# Patient Record
Sex: Male | Born: 1965
Health system: Southern US, Community
[De-identification: ages and names within clinical notes are randomized; demographics above are authoritative.]

## PROBLEM LIST (undated history)

## (undated) DIAGNOSIS — J302 Other seasonal allergic rhinitis: Secondary | ICD-10-CM

## (undated) DIAGNOSIS — C439 Malignant melanoma of skin, unspecified: Secondary | ICD-10-CM

## (undated) DIAGNOSIS — J189 Pneumonia, unspecified organism: Secondary | ICD-10-CM

## (undated) DIAGNOSIS — J90 Pleural effusion, not elsewhere classified: Secondary | ICD-10-CM

## (undated) DIAGNOSIS — K219 Gastro-esophageal reflux disease without esophagitis: Secondary | ICD-10-CM

## (undated) DIAGNOSIS — D1771 Benign lipomatous neoplasm of kidney: Secondary | ICD-10-CM

## (undated) HISTORY — PX: SPINAL FUSION: SHX223

## (undated) HISTORY — PX: CERVICAL DISCECTOMY: SHX98

---

## 2002-12-11 ENCOUNTER — Encounter: Payer: Self-pay | Admitting: Internal Medicine

## 2002-12-11 ENCOUNTER — Encounter: Admission: RE | Admit: 2002-12-11 | Discharge: 2002-12-11 | Payer: Self-pay | Admitting: Internal Medicine

## 2003-01-09 ENCOUNTER — Encounter: Admission: RE | Admit: 2003-01-09 | Discharge: 2003-01-09 | Payer: Self-pay | Admitting: Internal Medicine

## 2003-01-09 ENCOUNTER — Encounter: Payer: Self-pay | Admitting: Internal Medicine

## 2003-12-04 ENCOUNTER — Encounter (INDEPENDENT_AMBULATORY_CARE_PROVIDER_SITE_OTHER): Payer: Self-pay | Admitting: *Deleted

## 2003-12-04 ENCOUNTER — Ambulatory Visit (HOSPITAL_BASED_OUTPATIENT_CLINIC_OR_DEPARTMENT_OTHER): Admission: RE | Admit: 2003-12-04 | Discharge: 2003-12-04 | Payer: Self-pay | Admitting: General Surgery

## 2003-12-04 ENCOUNTER — Ambulatory Visit (HOSPITAL_COMMUNITY): Admission: RE | Admit: 2003-12-04 | Discharge: 2003-12-04 | Payer: Self-pay | Admitting: General Surgery

## 2003-12-09 ENCOUNTER — Emergency Department (HOSPITAL_COMMUNITY): Admission: EM | Admit: 2003-12-09 | Discharge: 2003-12-09 | Payer: Self-pay | Admitting: Emergency Medicine

## 2004-05-14 ENCOUNTER — Emergency Department (HOSPITAL_COMMUNITY): Admission: EM | Admit: 2004-05-14 | Discharge: 2004-05-14 | Payer: Self-pay | Admitting: Emergency Medicine

## 2004-05-15 ENCOUNTER — Inpatient Hospital Stay (HOSPITAL_COMMUNITY): Admission: EM | Admit: 2004-05-15 | Discharge: 2004-05-19 | Payer: Self-pay | Admitting: Emergency Medicine

## 2004-05-15 ENCOUNTER — Ambulatory Visit: Payer: Self-pay | Admitting: Infectious Diseases

## 2005-10-10 ENCOUNTER — Emergency Department (HOSPITAL_COMMUNITY): Admission: EM | Admit: 2005-10-10 | Discharge: 2005-10-10 | Payer: Self-pay | Admitting: Emergency Medicine

## 2007-04-07 ENCOUNTER — Emergency Department (HOSPITAL_COMMUNITY): Admission: EM | Admit: 2007-04-07 | Discharge: 2007-04-07 | Payer: Self-pay | Admitting: Family Medicine

## 2007-09-17 ENCOUNTER — Emergency Department (HOSPITAL_COMMUNITY): Admission: EM | Admit: 2007-09-17 | Discharge: 2007-09-17 | Payer: Self-pay | Admitting: Family Medicine

## 2007-10-16 ENCOUNTER — Emergency Department (HOSPITAL_COMMUNITY): Admission: EM | Admit: 2007-10-16 | Discharge: 2007-10-16 | Payer: Self-pay | Admitting: Family Medicine

## 2009-03-02 ENCOUNTER — Emergency Department (HOSPITAL_COMMUNITY): Admission: EM | Admit: 2009-03-02 | Discharge: 2009-03-02 | Payer: Self-pay | Admitting: Family Medicine

## 2009-09-07 ENCOUNTER — Emergency Department (HOSPITAL_COMMUNITY): Admission: EM | Admit: 2009-09-07 | Discharge: 2009-09-07 | Payer: Self-pay | Admitting: Emergency Medicine

## 2010-07-06 ENCOUNTER — Inpatient Hospital Stay (INDEPENDENT_AMBULATORY_CARE_PROVIDER_SITE_OTHER)
Admission: RE | Admit: 2010-07-06 | Discharge: 2010-07-06 | Disposition: A | Discharge status: Home / Self Care | Payer: BC Managed Care – PPO | Source: Ambulatory Visit | Attending: Family Medicine | Admitting: Family Medicine

## 2010-07-06 ENCOUNTER — Ambulatory Visit (INDEPENDENT_AMBULATORY_CARE_PROVIDER_SITE_OTHER): Payer: BC Managed Care – PPO

## 2010-07-06 DIAGNOSIS — J40 Bronchitis, not specified as acute or chronic: Secondary | ICD-10-CM

## 2010-08-21 NOTE — Op Note (Signed)
[name], [name]                            ACCOUNT NO.:  [id number]   MEDICAL RECORD NO.:  [id number]                   PATIENT TYPE:  AMB   LOCATION:  DSC                                  FACILITY:  [facility]   PHYSICIAN:  Leonie Man, M.D.                DATE OF BIRTH:  03-18-1966   DATE OF PROCEDURE:  12/04/2003  DATE OF DISCHARGE:                                 OPERATIVE REPORT   PREOPERATIVE DIAGNOSES:  1.  Multiple dysplastic compound nevi of the back.  2.  Sebaceous cysts of the back.   POSTOPERATIVE DIAGNOSES:  1.  Multiple dysplastic compound nevi of the back.  2.  Sebaceous cysts of the back.  3.  Pathology pending.   OPERATION/PROCEDURE:  Excision of multiple lesions on the back (five).   SURGEON:  Leonie Man, M.D.   ASSISTANT:  Nurse.   ANESTHESIA:  MAC using 0.5% Marcaine with epinephrine 1:200,000.   INDICATIONS:  This patient is a 45 year old man referred courtesy of Dr.  Doristine Section for the following excision of dysplastic compound nevi with  melanocytic atypia of his back.  The patient comes to the operating room now  for reexcision of these lesions.  He also has a small nevus overlying a  nodular mass of his back which I suspect to be a sebaceous cyst underlying a  nevus.  The risks and potential benefits of surgery have been discussed with  the patient and all questions answered and consent obtained.   DESCRIPTION OF PROCEDURE:  Following the induction of satisfactory sedation  with the patient positioned on the prone position, the upper back is prepped  and draped to be included in the sterile operative field.  Each of the  lesions is infiltrated with 0.5% Marcaine with epinephrine and excised as  follows:   Lesion #1 located in the upper mid central back was excised with an  elliptical incision.  Lesion #2, right lateral lower back was excised with an elliptical incision.  Lesion #3, right medial lower back was reexcised using an elliptical  incision.  Lesion #4, left lateral lower back was also excised in this manner.  Lower mid central back was really a sebaceous cyst with overlying nevus was  located and this was similarly excised.   All lesions were excised with full-thickness skin and some subcutaneous  tissues.  Hemostasis was obtained in all wounds.  The skin was undermined so  as to afford tension-free closure.  The skin incisions were closed with  subcuticular running sutures of 4-0 Monocryl and these were then reinforced  with Steri-Strips.  Sterile dressings applied.  The anesthetic was reversed  and the patient removed from the operating room to the recovery room in  stable condition.  He tolerated the procedure well.  Leonie Man, M.D.    PB/MEDQ  D:  12/04/2003  T:  12/04/2003  Job:  [id number]   cc:   Karie Soda. Joseph Art, M.D.  [address]  [place]  [place] [address]  Fax: [phone]

## 2010-08-21 NOTE — Op Note (Signed)
NAMERAGNAR, WAAS NO.:  [id number]   MEDICAL RECORD NO.:  [id number]                   PATIENT TYPE:  EMS   LOCATION:  ED                                   FACILITY:  [facility]   PHYSICIAN:  Sandria Bales. Ezzard Standing, M.D.               DATE OF BIRTH:  08-16-1965   DATE OF PROCEDURE:  12/09/2003  DATE OF DISCHARGE:                                 OPERATIVE REPORT   PREOPERATIVE DIAGNOSIS:  Burst wound, left back.   POSTOPERATIVE DIAGNOSIS:  Burst wound, left back approximately 2.5 cm in  length.   PROCEDURE:  Closure of wound with 3-0 nylon suture.   ANESTHESIA:  4 mL 1% Xylocaine with epinephrine.   COMPLICATIONS:  None.   SURGEON:  Dr. Ezzard Standing   INDICATIONS FOR PROCEDURE:  Mr. Elison is a 45 year old male, who had  multiple lesions excised from his back approximately 4 days ago.  One wound  on the left back burst open some time early this morning, and I spoke to his  wife, and he was brought to the emergency room for me to evaluate the wound.  I cleaned it with Betadine, irrigated it with saline, infiltrated the skin  with 1% Xylocaine, about 4 mL, and closed the wound with two interrupted 3-0  nylon sutures.   I explained to the patient and his wife, and his wife is a Engineer, civil (consulting), that there  may be an increased risk of infection.  But certainly most of the time,  closure this early after wound burst open, the wound will go ahead and heal  without problems.  They are to make an appointment already to see Dr. Lurene Shadow  back in approximately 2 weeks from the original excision of the wounds, and  that should be okay, and they will see him at that time.  Also, his other  Steri-Strips had come off, so actually I reinforced the other wounds with  Steri-Strips just so there would be no other problems.                                               Sandria Bales. Ezzard Standing, M.D.    DHN/MEDQ  D:  12/09/2003  T:  12/09/2003  Job:  [id number]   cc:   Leonie Man, M.D.  [address]  [address]  [place]  [place] [address]  Fax: [phone]

## 2010-08-21 NOTE — Discharge Summary (Signed)
[name], BODIE                ACCOUNT NO.:  [id number]   MEDICAL RECORD NO.:  [id number]          PATIENT TYPE:  INP   LOCATION:  5703                         FACILITY:  [facility]   PHYSICIAN:  Jonna L. Robb Matar, M.D.DATE OF BIRTH:  1965-05-26   DATE OF ADMISSION:  05/15/2004  DATE OF DISCHARGE:  05/19/2004                                 DISCHARGE SUMMARY   FINAL DIAGNOSES:  1.  Viral hepatitis, probably EBV or CMV.  2.  Hypokalemia.  3.  Chronic back pain secondary to multiple spinal surgeries.  4.  Abnormal ultrasound.   CONSULTATIONS:  Dr. Maurice March from Infectious Disease.   OPERATIONS:  None.   ALLERGIES:  None.   CODE STATUS:  Full.   HISTORY OF PRESENT ILLNESS:  This 45 year old man presented with fever,  headache that did not clear with 3 days of Zithromax. He had a sore throat  and swollen glands, which resolved and he has been left with persistent  headache, chills, and aching. In the emergency room, he was found to have a  temperature of 103. White count of 4.4 and elevated liver function tests. ET  was negative.   PHYSICAL EXAMINATION:  HEENT:  Besides the fever, did not show any icterus.  NECK:  There was an anterior scar on the neck.  LUNGS:  Clear.  ABDOMEN:  There was no hepatomegaly at that time.  BACK:  Multiple healed scars on the back and dysplastic nevi.   HOSPITAL COURSE:  The patient was put on Rocephin, which was discontinued in  48 hours. Evaluation of liver, in view of the fact that the patient took 5  or 6 grams of Tylenol for 2 day, but he does tend to drink 3 to 4 cans of  beer daily. Ultrasound showed fatty infiltration versus chronic hepatitis or  early cirrhosis. CT and MRI of the head was negative. Dr. Maurice March saw the  patient in consultation on May 17, 2004 and felt that there was no  bacterial process and he was more than likely to have a viral process.  Hepatitis A, B, and C titers were all negative. Malachi Carl was positive  for IGG  and negative for IGM and cytomegalic virus was still pending at the  time of discharge. The patient has some mild dehydration clinically,  although his BUN and creatinine remains normal. This improved with increased  fluids.   DISPOSITION:  The patient is discharged on some Roxicodone 5 mg every 4  hours p.r.n. headache. He is to get extra rest. Drink extra fluids. Avoid  Tylenol, aspirin or any other medications that would irritate his liver. He  should see Dr. Barbee Shropshire back in a week to compare his AST, which came in at  70 and was down to 56 and ALT, which was 71 and came down to 43. To followup  on the CMV and EBV. Blood cultures, stool cultures, urine cultures,  Giardia, Cryptosporidium were all negative. I also suggest getting a  followup ultrasound of the liver in about 6 to 8 weeks to make sure that  this is an acute  process that will clear, as opposed to some type of chronic  process that may require further evaluation.      JLB/MEDQ  D:  05/19/2004  T:  05/19/2004  Job:  [id number]   cc:   Olene Craven, M.D.  [address]  [address]  [place]  [place] [address]  Fax: [phone]

## 2010-08-21 NOTE — H&P (Signed)
NAMEBRESLIN, [name] NO.:  [id number]   MEDICAL RECORD NO.:  [id number]          PATIENT TYPE:  EMS   LOCATION:  MAJO                         FACILITY:  [facility]   PHYSICIAN:  Elliot Cousin, M.D.    DATE OF BIRTH:  09/03/1965   DATE OF ADMISSION:  05/15/2004  DATE OF DISCHARGE:                                HISTORY & PHYSICAL   </CHIEF COMPLAINT>/  :  Fever and headache.   HISTORY OF PRESENT ILLNESS:  The patient is a 45 year old man with a past  medical history significant for pneumonia, cervical diskectomy, and  hyperlipidemia, who presents to the emergency department with a history of  fever and headache.  The patient actually presented to the emergency  department yesterday with the same chief complaint.  During the evaluation  yesterday, a chest x-ray revealed a residual scar at the left lung base but  no acute abnormalities.  He was febrile with a temperature of 101.5 while in  the emergency department yesterday.  The patient was recently prescribed  Zithromax for three days prior to his presentation to the emergency  department yesterday.  He completed the three day course of Zithromax called  in by a physician on call for his primary care physician.  The patient was  sent home from the ED yesterday.  He got up this morning and, again, had  fever and chills and, therefore, returned to the emergency department.   The patient had a sore throat which started approximately six days ago.  The  sore throat was accompanied by a possible swollen lymph gland of his left  neck.  The sore throat and the swelling of his left neck completely  resolved.  A couple of days later, he developed a headache mostly in the  back of his eyes and in the temporal areas bilaterally.  The headache was  considered intense and throbbing.  The intensity was rated as an 8-9 over 10  in intensity.  He also had subjective fever, chills, and some achiness.  He  took Tylenol and Niquil as  well as Excedrin multiple times over the course  of the past 3-4 days.  These medications did help relieve the headache ,  fever,  and achiness to a small to moderate extent.  The patient no  complaints of a  stiff neck; he denies photophobia; no dizziness;  no nausea  and vomiting.  He has had some light headedness,  but no spinning dizziness.  He denies any visual changes.  He has had a dry cough on and off for the  past 3-4 days but no shortness of breath.  He has had loose stools x 2 but  no abdominal pain.  No pain with urination.  No bloody stools or bloody  urine.  No joint swelling.  No rash.   When the patient was seen in the emergency department this morning, his  temperature was 103.3, his white blood cell count was within normal limits  at 4.4, his urinalysis revealed 3-6 RBCs, and his SGOT was elevated at 70  and SGPT elevated  at 38.  The CT scan of the head revealed no acute disease.  The patient will be admitted for further observation and management.   PAST MEDICAL HISTORY:  1.  Pneumonia with effusion in June 2004.  2.  Gastroesophageal reflux disease.  3.  Hyperlipidemia.  4.  History of pulmonary nodule in the past followed by serial CT scans of      the chest.  5.  History of perforated septum/sinusitis in 2004.  6.  Cervical discectomy with hardware in 1998.  7.  L1 lumbar surgery with hardware in 1993.  8.  History of anal fissure.  9.  Multiple dysplastic compound nevi on the back status post excision in      August 2005 by Dr. Lurene Shadow.   MEDICATIONS:  1.  Prilosec OTC 1 tablet daily.  2.  Red rice yeast (supplement for hyperlipidemia).   ALLERGIES:  No known drug allergies.   SOCIAL HISTORY:  The patient is married and has one child.  He lives in  [place] with his wife.  His wife is currently a Engineer, civil (consulting) at [employer] [employer].  He is retired from [employer].  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  He denies tobacco use.  He drinks  3-4 beers  per night.  He denies any illicit drug use.  He can read and write.   FAMILY HISTORY:  The patient is adopted but recently found his biological  parents.  His medical history of his parents is limited, however, he states  that his biological father is 40 years of age and has osteoarthritis.  His  biological mother is 61 years of age and has peripheral vascular disease and  hypoglycemia.   REVIEW OF SYMPTOMS:  Positive for chronic low back pain and heartburn when  he does not take the Prilosec OTC.  Otherwise, the review of systems is  negative.   PHYSICAL EXAMINATION:  VITAL SIGNS:  Temperature 103.3, repeated at 98.7, blood pressure 131/68,  pulse 130, repeated at 93, respiratory rate 25, repeated at 20, oxygen  saturation 99 on room air.  GENERAL:  The patient is a pleasant, medium built 45 year old Caucasian man  who is currently lying in bed in no acute distress.  HEENT:  Head is normocephalic, atraumatic.  Pupils equal, round, reactive to  light.  Extraocular movements intact.  Conjunctivae clear.  Sclerae white.  Tympanic membranes are clear bilaterally.  No evidence of erythema or edema.  Nasal mucosa is moist.  No active drainage.  No tenderness over the  maxillary sinuses although there was some mild to moderate tenderness over  the ethmoid and frontal sinuses bilaterally.  NECK:  Supple.  There is a well healed anterior scar.  No nuchal rigidity.  No JVD, no adenopathy, no thyromegaly.  LUNGS:  Clear to auscultation bilaterally.  Breathing nonlabored.  HEART:  S1 and S2 with no murmurs, gallops, and rubs.  ABDOMEN:  Mildly obese, positive bowel sounds, soft, nontender,  nondistended, no hepatosplenomegaly.  RECTAL AND GU:  Deferred.  EXTREMITIES:  Pedal pulses are 2+ bilaterally.  No pretibial edema, no pedal  edema.  No acute joint changes.  The patient has a good range of motion of  all his joints. NEUROLOGICAL:  The patient is alert and oriented x 3.   Cranial nerves 2-12  are intact.  Strength 5/5 throughout, sensation is intact, gait is within  normal limits.  No asterixis.  Cerebellar is within normal limits.  Kernig's  sign negative.  SKIN:  The patient has multiple well healed scars on his back status post  excision of nevi.  He has a well healed lumbar scar.   ADMISSION LABORATORY DATA:  CT scan of the head is completely negative.  Chest x-ray reveals abnormal density in the right cardiophrenic angle.  Question new since September 2004.  CT scan of the chest for follow up of  the chest x-ray findings reveal normal epicardial fat pad, no mass  (preliminary reading).  WBC 4.4, hemoglobin 16.3, hematocrit 45.8, MCV 86.1,  platelets 185, absolute neutrophil count 3.  Urinalysis color yellow,  glucose negative, hemoglobin large, urine spec. grav. 1.022, ketones  negative, protein 100, urobilinogen 0.2, nitrite negative, leukocyte  esterase negative, WBC 0-2, RBC 3-6.  Sodium 133, potassium 3.4, chloride  102, CO2 21, glucose 107, BUN 8, creatinine 1.3, calcium 8.8, total protein  7.1, albumin 3.7, AST 70, ALT 71, alkaline phos 89, total bilirubin 0.9.   ASSESSMENT:  1.  Fever, chills, headache.  The differential diagnoses include viral      syndrome/upper respiratory infection, bacterial versus viral sinusitis,      viral meningitis.  Bacterial meningitis is less likely.  Currently, the      patient does not have any signs of meningismus.  He is febrile and does      have mild to moderate ethmoid sinus tenderness.  There are no focal      neurological deficits.  2.  Possible ethmoid/frontal sinusitis.  The results of the CT scan of the      head were completely normal, although specifically there were no      specific assessments or specific results regarding the patient's      sinuses.  3.  Elevated liver transaminases.  The patient admits to drinking  beer 3-4      cans nightly.  However, his wife who is a nurse points out that  the      patient has taken at least 5-6 grams of Tylenol over the past two days.      The amount of Tylenol could actually be more.  4.  Microhematuria/hemoglobinuria.  The patient has no complaints of      dysuria.  There has been no evidence of gross hematuria.   PLAN:  1.  The patient will be admitted for further evaluation.  2.  Blood cultures x 2 have been ordered.  3.  The patient was given Tylenol in the emergency department for fever.  4.  Will start Rocephin 1 gram IV empirically.  5.  Will hold Tylenol until acetaminophen level comes back and treat fever      as needed with Motrin.  6.  Nasonex b.i.d. and as needed saline nasal spray.  7.  Will check a creatinine kinase level, urine for myoglobin, and a STAT      acetaminophen level.  8.  Will repeat urinalysis in the morning.  9.  Will also check a viral hepatitis panel.  10. Will assess the patient's sinuses with a dedicated CT scan of the     sinuses.  Will also assess the patient's hepatobiliary tree with an      ultrasound of the abdomen.  11. IV fluids at 100 mL an hour (the patient has already received 1.5 liters      of normal saline).  12. Will hold on further assessment with LP and MRI of the brain because of      probable low  yield at this point.  13. Prn Ativan.      DF/MEDQ  D:  05/15/2004  T:  05/15/2004  Job:  [id number]   cc:   Olene Craven, M.D.  [address]  [address]  [place]  [place] [address]  Fax: [phone]

## 2010-11-03 ENCOUNTER — Other Ambulatory Visit: Payer: Self-pay | Admitting: Dermatology

## 2010-11-19 ENCOUNTER — Other Ambulatory Visit: Payer: Self-pay | Admitting: Dermatology

## 2010-12-08 ENCOUNTER — Other Ambulatory Visit: Payer: Self-pay | Admitting: Dermatology

## 2011-06-06 ENCOUNTER — Encounter (HOSPITAL_COMMUNITY): Payer: Self-pay | Admitting: *Deleted

## 2011-06-06 ENCOUNTER — Emergency Department (INDEPENDENT_AMBULATORY_CARE_PROVIDER_SITE_OTHER)
Admission: EM | Admit: 2011-06-06 | Discharge: 2011-06-06 | Disposition: A | Discharge status: Home / Self Care | Payer: Managed Care, Other (non HMO) | Source: Home / Self Care | Attending: Emergency Medicine | Admitting: Emergency Medicine

## 2011-06-06 DIAGNOSIS — R05 Cough: Secondary | ICD-10-CM

## 2011-06-06 DIAGNOSIS — R059 Cough, unspecified: Secondary | ICD-10-CM

## 2011-06-06 HISTORY — DX: Pneumonia, unspecified organism: J18.9

## 2011-06-06 HISTORY — DX: Other seasonal allergic rhinitis: J30.2

## 2011-06-06 HISTORY — DX: Pleural effusion, not elsewhere classified: J90

## 2011-06-06 HISTORY — DX: Gastro-esophageal reflux disease without esophagitis: K21.9

## 2011-06-06 MED ORDER — PSEUDOEPHEDRINE-GUAIFENESIN ER 120-1200 MG PO TB12: 1.0000 | ORAL_TABLET | Freq: Two times a day (BID) | ORAL | Status: DC | PRN

## 2011-06-06 MED ORDER — FLUTICASONE PROPIONATE 50 MCG/ACT NA SUSP: 2.0000 | Freq: Every day | NASAL | Status: DC

## 2011-06-06 MED ORDER — HYDROCODONE-ACETAMINOPHEN 7.5-500 MG/15ML PO SOLN: 5.0000 mL | Freq: Four times a day (QID) | ORAL | Status: AC | PRN

## 2011-06-06 MED ORDER — ALBUTEROL SULFATE HFA 108 (90 BASE) MCG/ACT IN AERS: 1.0000 | INHALATION_SPRAY | Freq: Four times a day (QID) | RESPIRATORY_TRACT | Status: DC | PRN

## 2011-06-06 NOTE — ED Provider Notes (Signed)
History     CSN: [id number]  Arrival date & time 06/06/11  4098   First MD Initiated Contact with Patient 06/06/11 [phone]      Chief Complaint  Patient presents with  . Cough  . Diarrhea    (Consider location/radiation/quality/duration/timing/severity/associated sxs/prior treatment) HPI Comments: Patient with dry, nonproductive cough primarily at night for 2 weeks. Reports postnasal drip, nasal congestion. States he has a "tickle in the back of his throat", which is causing the coughing. Starting yesterday, reports bodyaches, malaise, occasional abdominal cramping and several episodes of watery, nonbloody diarrhea. He feels feverish, but no documented fevers at home. Reports coughing spell with posttussive emesis today. Is otherwise tolerating by mouth. No chest pain, wheezing, shortness of breath, abdominal pain. Has been taking Delsym without relief. Is taking Protonix for reflux, denies any reflux type symptoms. No other medications. No acute, watery eyes, allergy-type symptoms. Has a history of pneumonia, remote.   ROS as noted in HPI. All other ROS negative.   Patient is a 46 y.o. male presenting with cough and diarrhea. The history is provided by the patient. No language interpreter was used.  Cough This is a new problem. The current episode started more than 1 week ago. The cough is non-productive. There has been no fever. Associated symptoms include rhinorrhea, sore throat and myalgias. Pertinent negatives include no chest pain, no chills, no sweats, no weight loss, no ear pain, no headaches, no shortness of breath and no wheezing. He has tried cough syrup for the symptoms. The treatment provided mild relief. Smoker: Former smoker. His past medical history is significant for pneumonia. His past medical history does not include bronchitis, COPD, emphysema or asthma.  Diarrhea The primary symptoms include diarrhea and myalgias. Primary symptoms do not include weight loss.  The illness  does not include chills.    Past Medical History  Diagnosis Date  . GERD (gastroesophageal reflux disease)   . Seasonal allergies   . Pneumonia   . Pleural effusion, left     Past Surgical History  Procedure Date  . Spinal fusion   . Cervical discectomy     History reviewed. No pertinent family history.  History  Substance Use Topics  . Smoking status: Former Games developer  . Smokeless tobacco: Not on file  . Alcohol Use: Yes     Occasional      Review of Systems  Constitutional: Negative for chills and weight loss.  HENT: Positive for sore throat and rhinorrhea. Negative for ear pain.   Respiratory: Positive for cough. Negative for shortness of breath and wheezing.   Cardiovascular: Negative for chest pain.  Gastrointestinal: Positive for diarrhea.  Musculoskeletal: Positive for myalgias.  Neurological: Negative for headaches.    Allergies  Review of patient's allergies indicates no known allergies.  Home Medications   Current Outpatient Rx  Name Route Sig Dispense Refill  . OMEPRAZOLE MAGNESIUM 20 MG PO TBEC Oral Take 20 mg by mouth daily.    . ALBUTEROL SULFATE HFA 108 (90 BASE) MCG/ACT IN AERS Inhalation Inhale 1-2 puffs into the lungs every 6 (six) hours as needed for wheezing. 1 Inhaler 0  . FLUTICASONE PROPIONATE 50 MCG/ACT NA SUSP Nasal Place 2 sprays into the nose daily. 16 g 0  . HYDROCODONE-ACETAMINOPHEN 7.5-500 MG/15ML PO SOLN Oral Take 5 mLs by mouth every 6 (six) hours as needed for pain. 120 mL 0  . PSEUDOEPHEDRINE-GUAIFENESIN ER [phone] MG PO TB12 Oral Take 1 tablet by mouth 2 (two) times daily as needed (  congestion). 20 each 0    BP 139/93  Pulse 102  Temp(Src) 98.1 F (36.7 C) (Oral)  Resp 19  SpO2 96%  Physical Exam  Nursing note and vitals reviewed. Constitutional: He is oriented to person, place, and time. He appears well-developed and well-nourished. No distress.  HENT:  Head: Normocephalic and atraumatic.  Left Ear: Tympanic membrane  normal.  Mouth/Throat: Oropharynx is clear and moist.       Irritated nasal mucosa, crusted blood. Oropharynx cobblestone, irritated. Tonsils within normal limits  Eyes: Conjunctivae and EOM are normal.  Neck: Normal range of motion.  Cardiovascular: Normal rate, regular rhythm, normal heart sounds and intact distal pulses.   Pulmonary/Chest: Effort normal and breath sounds normal. He exhibits no tenderness.  Abdominal: He exhibits no distension.  Musculoskeletal: Normal range of motion.  Neurological: He is alert and oriented to person, place, and time.  Skin: Skin is warm and dry.  Psychiatric: He has a normal mood and affect. His behavior is normal.    ED Course  Procedures (including critical care time)   Labs Reviewed  POCT RAPID STREP A ([facility] ONLY)   No results found.   1. Cough     Results for orders placed during the hospital encounter of 06/06/11  POCT RAPID STREP A ([facility] ONLY)      Component Value Range   Streptococcus, Group A Screen (Direct) NEGATIVE  NEGATIVE      MDM  No fevers, lungs clear. Has a history of reflux, but also has apparent URI-like symptoms. Doubt pneumonia at this time.  Deferring chest x-ray. Will try supportive treatment, and have him followup with his PMD if needed.  Luiz Blare, MD 06/06/11 1121

## 2011-06-06 NOTE — ED Notes (Signed)
Started w/ productive cough "just in mornings" over past 2 weeks.  Starting 2-3 days ago cough started progressing throughout the day.  Yesterday "started not feeling well", started with diarrhea and abd cramping.  Last night had severe coughing fit which caused dry heaves.  Unsure if any fevers at home.  Has been taking Delsym and OTC cold med.

## 2011-07-29 ENCOUNTER — Other Ambulatory Visit: Payer: Self-pay | Admitting: Dermatology

## 2014-03-17 ENCOUNTER — Emergency Department (HOSPITAL_COMMUNITY): Payer: 59

## 2014-03-17 ENCOUNTER — Inpatient Hospital Stay (HOSPITAL_COMMUNITY)
Admission: EM | Admit: 2014-03-17 | Discharge: 2014-03-22 | DRG: 981 | Disposition: A | Discharge status: Home / Self Care | Payer: 59 | Attending: Urology | Admitting: Urology

## 2014-03-17 ENCOUNTER — Encounter (HOSPITAL_COMMUNITY): Payer: Self-pay | Admitting: *Deleted

## 2014-03-17 DIAGNOSIS — D1771 Benign lipomatous neoplasm of kidney: Principal | ICD-10-CM | POA: Diagnosis present

## 2014-03-17 DIAGNOSIS — R58 Hemorrhage, not elsewhere classified: Secondary | ICD-10-CM | POA: Insufficient documentation

## 2014-03-17 DIAGNOSIS — D62 Acute posthemorrhagic anemia: Secondary | ICD-10-CM | POA: Diagnosis present

## 2014-03-17 DIAGNOSIS — I722 Aneurysm of renal artery: Secondary | ICD-10-CM | POA: Diagnosis present

## 2014-03-17 DIAGNOSIS — Z87891 Personal history of nicotine dependence: Secondary | ICD-10-CM

## 2014-03-17 DIAGNOSIS — K219 Gastro-esophageal reflux disease without esophagitis: Secondary | ICD-10-CM | POA: Diagnosis present

## 2014-03-17 DIAGNOSIS — N179 Acute kidney failure, unspecified: Secondary | ICD-10-CM | POA: Diagnosis present

## 2014-03-17 DIAGNOSIS — D3001 Benign neoplasm of right kidney: Secondary | ICD-10-CM

## 2014-03-17 DIAGNOSIS — K661 Hemoperitoneum: Secondary | ICD-10-CM | POA: Diagnosis present

## 2014-03-17 DIAGNOSIS — Z85820 Personal history of malignant melanoma of skin: Secondary | ICD-10-CM

## 2014-03-17 DIAGNOSIS — D72829 Elevated white blood cell count, unspecified: Secondary | ICD-10-CM | POA: Diagnosis present

## 2014-03-17 DIAGNOSIS — N2889 Other specified disorders of kidney and ureter: Secondary | ICD-10-CM | POA: Diagnosis present

## 2014-03-17 DIAGNOSIS — D179 Benign lipomatous neoplasm, unspecified: Secondary | ICD-10-CM

## 2014-03-17 HISTORY — DX: Malignant melanoma of skin, unspecified: C43.9

## 2014-03-17 LAB — CBC WITH DIFFERENTIAL/PLATELET
BASOS PCT: 0 % (ref 0–1)
Basophils Absolute: 0.1 10*3/uL (ref 0.0–0.1)
EOS ABS: 0 10*3/uL (ref 0.0–0.7)
EOS PCT: 0 % (ref 0–5)
HCT: 41.1 % (ref 39.0–52.0)
Hemoglobin: 14.7 g/dL (ref 13.0–17.0)
LYMPHS ABS: 1.1 10*3/uL (ref 0.7–4.0)
LYMPHS PCT: 6 % — AB (ref 12–46)
MCH: 32.2 pg (ref 26.0–34.0)
MCHC: 35.8 g/dL (ref 30.0–36.0)
MCV: 89.9 fL (ref 78.0–100.0)
MONO ABS: 1.6 10*3/uL — AB (ref 0.1–1.0)
MONOS PCT: 8 % (ref 3–12)
NEUTROS PCT: 86 % — AB (ref 43–77)
Neutro Abs: 16.7 10*3/uL — ABNORMAL HIGH (ref 1.7–7.7)
Platelets: 222 10*3/uL (ref 150–400)
RBC: 4.57 MIL/uL (ref 4.22–5.81)
RDW: 12 % (ref 11.5–15.5)
WBC: 19.5 10*3/uL — AB (ref 4.0–10.5)

## 2014-03-17 LAB — COMPREHENSIVE METABOLIC PANEL
ALK PHOS: 56 U/L (ref 39–117)
ALT: 22 U/L (ref 0–53)
ANION GAP: 25 — AB (ref 5–15)
AST: 31 U/L (ref 0–37)
Albumin: 4.3 g/dL (ref 3.5–5.2)
BILIRUBIN TOTAL: 0.9 mg/dL (ref 0.3–1.2)
BUN: 23 mg/dL (ref 6–23)
CALCIUM: 9.6 mg/dL (ref 8.4–10.5)
CO2: 18 mEq/L — ABNORMAL LOW (ref 19–32)
CREATININE: 1.36 mg/dL — AB (ref 0.50–1.35)
Chloride: 95 mEq/L — ABNORMAL LOW (ref 96–112)
GFR calc Af Amer: 69 mL/min — ABNORMAL LOW (ref >90)
GFR calc non Af Amer: 60 mL/min — ABNORMAL LOW (ref >90)
Glucose, Bld: 166 mg/dL — ABNORMAL HIGH (ref 70–99)
POTASSIUM: 4 meq/L (ref 3.7–5.3)
Sodium: 138 mEq/L (ref 137–147)
TOTAL PROTEIN: 7.9 g/dL (ref 6.0–8.3)

## 2014-03-17 LAB — LIPASE, BLOOD: LIPASE: 44 U/L (ref 11–59)

## 2014-03-17 LAB — URINALYSIS, ROUTINE W REFLEX MICROSCOPIC
BILIRUBIN URINE: NEGATIVE
GLUCOSE, UA: NEGATIVE mg/dL
Hgb urine dipstick: NEGATIVE
KETONES UR: 15 mg/dL — AB
Leukocytes, UA: NEGATIVE
NITRITE: NEGATIVE
PH: 6 (ref 5.0–8.0)
PROTEIN: NEGATIVE mg/dL
SPECIFIC GRAVITY, URINE: 1.014 (ref 1.005–1.030)
UROBILINOGEN UA: 0.2 mg/dL (ref 0.0–1.0)

## 2014-03-17 LAB — I-STAT CG4 LACTIC ACID, ED: LACTIC ACID, VENOUS: 2.79 mmol/L — AB (ref 0.5–2.2)

## 2014-03-17 MED ORDER — HYDROMORPHONE HCL 1 MG/ML IJ SOLN
0.5000 mg | INTRAMUSCULAR | Status: DC | PRN
  Administered 2014-03-17 – 2014-03-22 (×16): 1 mg via INTRAVENOUS
  Filled 2014-03-17 (×16): qty 1

## 2014-03-17 MED ORDER — HYDROMORPHONE HCL 1 MG/ML IJ SOLN
1.0000 mg | Freq: Once | INTRAMUSCULAR | Status: AC
  Administered 2014-03-17: 1 mg via INTRAVENOUS
  Filled 2014-03-17: qty 1

## 2014-03-17 MED ORDER — SODIUM CHLORIDE 0.9 % IV BOLUS (SEPSIS)
1000.0000 mL | Freq: Once | INTRAVENOUS | Status: AC
  Administered 2014-03-17: 1000 mL via INTRAVENOUS

## 2014-03-17 MED ORDER — KCL IN DEXTROSE-NACL 20-5-0.9 MEQ/L-%-% IV SOLN
INTRAVENOUS | Status: DC
  Administered 2014-03-18: 01:00:00 via INTRAVENOUS
  Filled 2014-03-17 (×3): qty 1000

## 2014-03-17 MED ORDER — ONDANSETRON HCL 4 MG/2ML IJ SOLN
4.0000 mg | INTRAMUSCULAR | Status: DC | PRN
  Administered 2014-03-17: 4 mg via INTRAVENOUS
  Filled 2014-03-17: qty 2

## 2014-03-17 MED ORDER — DIPHENHYDRAMINE HCL 50 MG/ML IJ SOLN: 12.5000 mg | Freq: Four times a day (QID) | INTRAMUSCULAR | Status: DC | PRN

## 2014-03-17 MED ORDER — ALBUTEROL SULFATE HFA 108 (90 BASE) MCG/ACT IN AERS: 1.0000 | INHALATION_SPRAY | Freq: Four times a day (QID) | RESPIRATORY_TRACT | Status: DC | PRN

## 2014-03-17 MED ORDER — DIPHENHYDRAMINE HCL 12.5 MG/5ML PO ELIX: 12.5000 mg | ORAL_SOLUTION | Freq: Four times a day (QID) | ORAL | Status: DC | PRN

## 2014-03-17 MED ORDER — PROCHLORPERAZINE EDISYLATE 5 MG/ML IJ SOLN
10.0000 mg | Freq: Once | INTRAMUSCULAR | Status: AC
  Administered 2014-03-17: 10 mg via INTRAVENOUS
  Filled 2014-03-17: qty 2

## 2014-03-17 MED ORDER — FLUTICASONE PROPIONATE 50 MCG/ACT NA SUSP
2.0000 | Freq: Every day | NASAL | Status: DC
  Administered 2014-03-19 – 2014-03-22 (×3): 2 via NASAL
  Filled 2014-03-17: qty 16

## 2014-03-17 MED ORDER — DIAZEPAM 5 MG/ML IJ SOLN
5.0000 mg | Freq: Once | INTRAMUSCULAR | Status: AC
  Administered 2014-03-17: 5 mg via INTRAVENOUS
  Filled 2014-03-17: qty 2

## 2014-03-17 MED ORDER — FENTANYL CITRATE 0.05 MG/ML IJ SOLN
50.0000 ug | Freq: Once | INTRAMUSCULAR | Status: AC
  Administered 2014-03-17: 50 ug via INTRAVENOUS
  Filled 2014-03-17: qty 2

## 2014-03-17 MED ORDER — DOCUSATE SODIUM 100 MG PO CAPS
100.0000 mg | ORAL_CAPSULE | Freq: Two times a day (BID) | ORAL | Status: DC
  Administered 2014-03-18 – 2014-03-22 (×8): 100 mg via ORAL
  Filled 2014-03-17 (×10): qty 1

## 2014-03-17 MED ORDER — ONDANSETRON HCL 4 MG/2ML IJ SOLN
4.0000 mg | Freq: Once | INTRAMUSCULAR | Status: AC
  Administered 2014-03-17: 4 mg via INTRAVENOUS
  Filled 2014-03-17: qty 2

## 2014-03-17 NOTE — ED Notes (Addendum)
Pt was raking leaves when he had a sudden onset of R lower back pain that radiates to R upper back.  Pain is accompanied by nausea.  Pt states he can tolerate sitting if he is hanging R leg over bed.  Pain increases with movement and inspiration.  Given 4 mg zofran by [facility] for nausea.  EKG unremarkable per EMS.  Hx of injury to same area.

## 2014-03-17 NOTE — ED Notes (Signed)
MD at bedside. 

## 2014-03-17 NOTE — ED Notes (Signed)
[facility] contacted for transport to [facility]

## 2014-03-17 NOTE — ED Notes (Signed)
Pt went to X-Ray.  

## 2014-03-17 NOTE — H&P (Signed)
H&P  Chief Complaint: Right renal/retroperitoneal mass  PCP: Dr. Domenick Gong  History of Present Illness: [name] is a 48 y.o. year old previously seen by Dr. Louis Meckel in our office last year.  He developed the acute onset of right flank pain today while working outside at his house which progressively became severe and was associated with shortness of breath and nausea.  He also felt dizzy and light headed.  He called 911 and was transported to the [facility] ED for evaluation.  He has been tachycardic here and his pain has been difficult to control still requiring frequent IV narcotic pain medication.  His Hgb is 14.6 and his WBC is over 19,000.  He had a CT stone study performed which revealed a large hemorrhagic right retroperitoneal mass with extensive hemorrhage and what appeared to be fat within the mass.  He retrospectively gives a history of being told he had a mass on the right kidney over 10 years ago and remembers it being called an angiomyolipoma.  He has not had follow up for that in over a decade.  An abdominal ultrasound was performed in February 2006 and at that time his right kidney appeared unremarkable.  It is unclear exactly when his diagnosis of an AML was made or what imaging he had at that time.  Past Medical History  Diagnosis Date  . GERD (gastroesophageal reflux disease)   . Seasonal allergies   . Pneumonia   . Pleural effusion, left   . Melanoma    Melanoma was superficial and has not required ongoing treatment.  Past Surgical History  Procedure Laterality Date  . Spinal fusion    . Cervical discectomy      Home Medications:    Medication List    ASK your doctor about these medications        albuterol 108 (90 BASE) MCG/ACT inhaler  Commonly known as:  PROVENTIL HFA;VENTOLIN HFA  Inhale 1-2 puffs into the lungs every 6 (six) hours as needed for wheezing.     fluticasone 50 MCG/ACT nasal spray  Commonly known as:  FLONASE  Place 2 sprays into the  nose daily.     omeprazole 20 MG tablet  Commonly known as:  PRILOSEC OTC  Take 20 mg by mouth daily.     Pseudoephedrine-Guaifenesin [phone] MG Tb12  Commonly known as:  MUCINEX D  Take 1 tablet by mouth 2 (two) times daily as needed (congestion).        Allergies: No Known Allergies  Family history: Pt is unaware of much of his family history as he was not raised by his natural father.  No known history of renal failure, renal disease, or renal tumors. No history of tuberous sclerosis.  Social History:  reports that he has quit smoking. He does not have any smokeless tobacco history on file. He reports that he drinks alcohol. He reports that he does not use illicit drugs.  He quit smoking in 1990 and smoked only a few cigarettes per day before then.  ROS: A complete review of systems was performed.  All systems are negative except for pertinent findings as noted.  Physical Exam:  Vital signs in last 24 hours: Temp:  [97.8 F (36.6 C)-98.1 F (36.7 C)] 98.1 F (36.7 C) (12/13 1801) Pulse Rate:  [84-118] 116 (12/13 2015) Resp:  [19-27] 24 (12/13 1801) BP: (98-159)/(58-139) 117/74 mmHg (12/13 2015) SpO2:  [93 %-100 %] 98 % (12/13 2015) Weight:  [86.183 kg (190 lb)] 86.183  kg (190 lb) (12/13 1607) Constitutional:  Alert and oriented, No acute distress, pt does appear somewhat pale Cardiovascular: Regular but tachycardic, No JVD Respiratory: Normal respiratory effort, Lungs clear bilaterally GI: Abdomen is soft, nondistended, no abdominal masses, Tender to palpation over right abdomen. GU: Severe right CVAT. Lymphatic: No lymphadenopathy Neurologic: Grossly intact, no focal deficits Psychiatric: Normal mood and affect Extremities: No lower extremity edema, his feet are cold to touch   Laboratory Data:   Recent Labs  03/17/14 1810  WBC 19.5*  HGB 14.7  HCT 41.1  PLT 222     Recent Labs  03/17/14 1810  NA 138  K 4.0  CL 95*  GLUCOSE 166*  BUN 23  CALCIUM  9.6  CREATININE 1.36*     Results for orders placed or performed during the hospital encounter of 03/17/14 (from the past 24 hour(s))  CBC with Differential     Status: Abnormal   Collection Time: 03/17/14  6:10 PM  Result Value Ref Range   WBC 19.5 (H) 4.0 - 10.5 K/uL   RBC 4.57 4.22 - 5.81 MIL/uL   Hemoglobin 14.7 13.0 - 17.0 g/dL   HCT 41.1 39.0 - 52.0 %   MCV 89.9 78.0 - 100.0 fL   MCH 32.2 26.0 - 34.0 pg   MCHC 35.8 30.0 - 36.0 g/dL   RDW 12.0 11.5 - 15.5 %   Platelets 222 150 - 400 K/uL   Neutrophils Relative % 86 (H) 43 - 77 %   Neutro Abs 16.7 (H) 1.7 - 7.7 K/uL   Lymphocytes Relative 6 (L) 12 - 46 %   Lymphs Abs 1.1 0.7 - 4.0 K/uL   Monocytes Relative 8 3 - 12 %   Monocytes Absolute 1.6 (H) 0.1 - 1.0 K/uL   Eosinophils Relative 0 0 - 5 %   Eosinophils Absolute 0.0 0.0 - 0.7 K/uL   Basophils Relative 0 0 - 1 %   Basophils Absolute 0.1 0.0 - 0.1 K/uL  Comprehensive metabolic panel     Status: Abnormal   Collection Time: 03/17/14  6:10 PM  Result Value Ref Range   Sodium 138 137 - 147 mEq/L   Potassium 4.0 3.7 - 5.3 mEq/L   Chloride 95 (L) 96 - 112 mEq/L   CO2 18 (L) 19 - 32 mEq/L   Glucose, Bld 166 (H) 70 - 99 mg/dL   BUN 23 6 - 23 mg/dL   Creatinine, Ser 1.36 (H) 0.50 - 1.35 mg/dL   Calcium 9.6 8.4 - 10.5 mg/dL   Total Protein 7.9 6.0 - 8.3 g/dL   Albumin 4.3 3.5 - 5.2 g/dL   AST 31 0 - 37 U/L   ALT 22 0 - 53 U/L   Alkaline Phosphatase 56 39 - 117 U/L   Total Bilirubin 0.9 0.3 - 1.2 mg/dL   GFR calc non Af Amer 60 (L) >90 mL/min   GFR calc Af Amer 69 (L) >90 mL/min   Anion gap 25 (H) 5 - 15  Lipase, blood     Status: None   Collection Time: 03/17/14  6:10 PM  Result Value Ref Range   Lipase 44 11 - 59 U/L  Urinalysis, Routine w reflex microscopic     Status: Abnormal   Collection Time: 03/17/14  6:22 PM  Result Value Ref Range   Color, Urine YELLOW YELLOW   APPearance CLEAR CLEAR   Specific Gravity, Urine 1.014 1.005 - 1.030   pH 6.0 5.0 - 8.0    Glucose,  UA NEGATIVE NEGATIVE mg/dL   Hgb urine dipstick NEGATIVE NEGATIVE   Bilirubin Urine NEGATIVE NEGATIVE   Ketones, ur 15 (A) NEGATIVE mg/dL   Protein, ur NEGATIVE NEGATIVE mg/dL   Urobilinogen, UA 0.2 0.0 - 1.0 mg/dL   Nitrite NEGATIVE NEGATIVE   Leukocytes, UA NEGATIVE NEGATIVE  I-Stat CG4 Lactic Acid, ED     Status: Abnormal   Collection Time: 03/17/14  8:31 PM  Result Value Ref Range   Lactic Acid, Venous 2.79 (H) 0.5 - 2.2 mmol/L   No results found for this or any previous visit (from the past 240 hour(s)).  Renal Function:  Recent Labs  03/17/14 1810  CREATININE 1.36*   Estimated Creatinine Clearance: 74.3 mL/min (by C-G formula based on Cr of 1.36).  Radiologic Imaging: Dg Chest 2 View  03/17/2014   CLINICAL DATA:  Acute right lower chest pain after raking leaves.  EXAM: CHEST  2 VIEW  COMPARISON:  July 06, 2010.  FINDINGS: The heart size and mediastinal contours are within normal limits. Both lungs are clear. No pneumothorax or pleural effusion is noted. Status post surgical posterior fusion of thoracolumbar junction.  IMPRESSION: No acute cardiopulmonary abnormality seen.   Electronically Signed   By: Sabino Dick M.D.   On: 03/17/2014 17:54   Dg Thoracic Spine 2 View  03/17/2014   CLINICAL DATA:  Pt states he was raking leaves and using leaf blower approximately 6 hours ago and within a couple of hours, he was unable to move; he's having right upper back pain, RLQ pain, and right lower chest pain; pt states he had a fusion at T12-L1 in 1993 and states he has h/o mild scoliosis.  EXAM: THORACIC SPINE - 2 VIEW  COMPARISON:  07/06/2010  FINDINGS: The patient has had previous cervical fusion and posterior fusion of T12-L2. There is a long-standing fracture of 1 of the transpedicular screws at L2. There are changes of diffuse idiopathic skeletal hyperostosis. No acute fracture or subluxation. No suspicious lytic or blastic lesions are identified.  IMPRESSION: No evidence for  acute  abnormality.   Electronically Signed   By: Shon Hale M.D.   On: 03/17/2014 17:57   Ct Renal Stone Study  03/17/2014   CLINICAL DATA:  Low back pain radiating into the right lower quadrant with nausea and vomiting. Symptoms began earlier today.  EXAM: CT ABDOMEN AND PELVIS WITHOUT CONTRAST  TECHNIQUE: Multidetector CT imaging of the abdomen and pelvis was performed following the standard protocol without IV contrast.  COMPARISON:  Abdominal ultrasound 05/15/2004  FINDINGS: There is a 4 mm nodule in the left lower lobe (series 205, image 9). Trace right pleural effusion is noted.  There is a large, complex fat containing mass in the right retroperitoneum measuring approximately 11.7 x 7.1 x 21.5 cm. There are complex internal soft tissue components. The lesion extends from the level of the right hemidiaphragm inferiorly to just below the lower pole of the right kidney. The mass is predominantly located posterior to the right kidney, which is markedly displaced anteriorly and flattened, however the mass also appears to partially encircle of the kidney. There is associated moderate volume acute retroperitoneal hemorrhage. The right adrenal gland is not clearly identified.  There is mass effect on the right lobe of the liver by the retroperitoneal mass. No focal liver lesion is identified. Gallbladder, spleen, left adrenal gland, left kidney, and pancreas have an unremarkable unenhanced appearance.  The small and large bowel are nondilated without evidence of obstruction. The  bladder is largely decompressed. Appendix is unremarkable. The infrahepatic IVC is displaced and moderately flattened by the right-sided retroperitoneal tumor. Minimal infra renal abdominal aortic atherosclerotic calcification is noted. No enlarged lymph nodes are identified. 1.6 cm dense sclerotic focus in the right sacrum likely represents a bone island. Diffuse flowing osteophytes in the visualized lower thoracic spine may represent  DISH. Prior instrumented posterior fusion has been performed from T12-L2.  IMPRESSION: 1. Large, complex fat containing right-sided retroperitoneal mass with associated acute retroperitoneal hemorrhage. This may represent a renal angiomyolipoma which has hemorrhaged, however other fat containing retroperitoneal masses such as liposarcoma are also considerations. 2. Trace right pleural effusion. 3. 4 mm left lower lobe lung nodule. If the patient is at high risk for bronchogenic carcinoma, follow-up chest CT at 1 year is recommended. If the patient is at low risk, no follow-up is needed. This recommendation follows the consensus statement: Guidelines for Management of Small Pulmonary Nodules Detected on CT Scans: A Statement from the [place] as published in Radiology 2005; 237:395-400. Critical Value/emergent results were called by telephone at the time of interpretation on 03/17/2014 at 7:04 pm to Dr. Audie Pinto, who verbally acknowledged these results.   Electronically Signed   By: Logan Bores   On: 03/17/2014 19:05    Impression/Assessment:  Right renal/retroperitoneal mass with acute hemorrhage.  This is most likely a renal angiomyolipoma just based on his history and the fact he appears to have had an acute bleed.  Other considerations include a liposarcoma of the retroperitoneum although acute hemorrhage would be less likely to occur with that diagnosis.  Plan:  1) Admit to [facility] hospital for Dr. Louis Meckel 2) IVF hydration, IV pain control 3) Serial monitoring of H/H, blood transfusion if needed 4) Check renal function in AM and if ok, he may benefit from contrast imaging for more definitive diagnostic evaluation. If not imaging is not definitive, a biopsy may be indicated. 5) He may require selective angioembolization if his mass is consistent with an AML. 6) NPO after MN   Cabela Pacifico,LES 03/17/2014, 8:48 PM  Pryor Curia. MD

## 2014-03-17 NOTE — ED Provider Notes (Signed)
CSN: [id number]     Arrival date & time 03/17/14  1603 History   First MD Initiated Contact with Patient 03/17/14 1605     Chief Complaint  Patient presents with  . Back Pain     (Consider location/radiation/quality/duration/timing/severity/associated sxs/prior Treatment) HPI  patient is a 48 year old male who presents complaining of back pain. He reports that while raking leaves approximately 2 hours prior to his arrival, he began to have some dull right mid back pain. He says it's worse with movement. He says that it became more sharp over the course of the ensuing hour, and became so severe, that he had difficulty twisting and bending, lifting his leg or walking. He denies any radiation of the pain, and denies any shooting down his leg.   He has also felt nausea and vomited. He says the pain has gradually begun to radiate around to the right side of his right flank. He has had no dysuria, no fever.  He has not had any trauma to his back.  Past Medical History  Diagnosis Date  . GERD (gastroesophageal reflux disease)   . Seasonal allergies   . Pneumonia   . Pleural effusion, left   . Melanoma    Past Surgical History  Procedure Laterality Date  . Spinal fusion    . Cervical discectomy     No family history on file. History  Substance Use Topics  . Smoking status: Former Research scientist (life sciences)  . Smokeless tobacco: Not on file  . Alcohol Use: Yes     Comment: Occasional    Review of Systems  Respiratory: Negative for cough, choking and shortness of breath.   Gastrointestinal: Positive for abdominal pain. Negative for blood in stool and anal bleeding.  Genitourinary: Negative for dysuria and hematuria.  Musculoskeletal: Positive for back pain.  All other systems reviewed and are negative.     Allergies  Review of patient's allergies indicates no known allergies.  Home Medications   Prior to Admission medications   Medication Sig Start Date End Date Taking? Authorizing Provider   omeprazole (PRILOSEC OTC) 20 MG tablet Take 20 mg by mouth daily.   Yes Historical Provider, MD  albuterol (PROVENTIL HFA;VENTOLIN HFA) 108 (90 BASE) MCG/ACT inhaler Inhale 1-2 puffs into the lungs every 6 (six) hours as needed for wheezing. 06/06/11 06/05/12  Melynda Ripple, MD  fluticasone (FLONASE) 50 MCG/ACT nasal spray Place 2 sprays into the nose daily. 06/06/11 06/05/12  Melynda Ripple, MD  Pseudoephedrine-Guaifenesin (MUCINEX D) [phone] MG TB12 Take 1 tablet by mouth 2 (two) times daily as needed (congestion). Patient not taking: Reported on 03/17/2014 06/06/11   Melynda Ripple, MD   BP 132/75 mmHg  Pulse 131  Temp(Src) 97.9 F (36.6 C) (Oral)  Resp 16  Ht 6\' 1"  (1.854 m)  Wt 196 lb 12.8 oz (89.268 kg)  BMI 25.97 kg/m2  SpO2 100% Physical Exam  Constitutional: He is oriented to person, place, and time. He appears well-developed and well-nourished. No distress.  HENT:  Head: Normocephalic and atraumatic.  Eyes: Pupils are equal, round, and reactive to light.  Neck: Normal range of motion.  Cardiovascular: Normal rate, regular rhythm and normal heart sounds.   Pulmonary/Chest: Effort normal and breath sounds normal. No respiratory distress.  Abdominal: Soft. There is tenderness. There is no guarding.  Musculoskeletal:   Right lower back tenderness , no midline tenderness or deformity  Neurological: He is alert and oriented to person, place, and time.  Skin: Skin is warm and dry.  Psychiatric: He has a normal mood and affect.  Nursing note and vitals reviewed.   ED Course  Procedures (including critical care time) Labs Review Labs Reviewed  URINALYSIS, ROUTINE W REFLEX MICROSCOPIC - Abnormal; Notable for the following:    Ketones, ur 15 (*)    All other components within normal limits  CBC WITH DIFFERENTIAL - Abnormal; Notable for the following:    WBC 19.5 (*)    Neutrophils Relative % 86 (*)    Neutro Abs 16.7 (*)    Lymphocytes Relative 6 (*)    Monocytes Absolute  1.6 (*)    All other components within normal limits  COMPREHENSIVE METABOLIC PANEL - Abnormal; Notable for the following:    Chloride 95 (*)    CO2 18 (*)    Glucose, Bld 166 (*)    Creatinine, Ser 1.36 (*)    GFR calc non Af Amer 60 (*)    GFR calc Af Amer 69 (*)    Anion gap 25 (*)    All other components within normal limits  I-STAT CG4 LACTIC ACID, ED - Abnormal; Notable for the following:    Lactic Acid, Venous 2.79 (*)    All other components within normal limits  LIPASE, BLOOD  PROTIME-INR  BASIC METABOLIC PANEL  CBC  CBC  CBC  CBC    Imaging Review Dg Chest 2 View  03/17/2014   CLINICAL DATA:  Acute right lower chest pain after raking leaves.  EXAM: CHEST  2 VIEW  COMPARISON:  July 06, 2010.  FINDINGS: The heart size and mediastinal contours are within normal limits. Both lungs are clear. No pneumothorax or pleural effusion is noted. Status post surgical posterior fusion of thoracolumbar junction.  IMPRESSION: No acute cardiopulmonary abnormality seen.   Electronically Signed   By: Sabino Dick M.D.   On: 03/17/2014 17:54   Dg Thoracic Spine 2 View  03/17/2014   CLINICAL DATA:  Pt states he was raking leaves and using leaf blower approximately 6 hours ago and within a couple of hours, he was unable to move; he's having right upper back pain, RLQ pain, and right lower chest pain; pt states he had a fusion at T12-L1 in 1993 and states he has h/o mild scoliosis.  EXAM: THORACIC SPINE - 2 VIEW  COMPARISON:  07/06/2010  FINDINGS: The patient has had previous cervical fusion and posterior fusion of T12-L2. There is a long-standing fracture of 1 of the transpedicular screws at L2. There are changes of diffuse idiopathic skeletal hyperostosis. No acute fracture or subluxation. No suspicious lytic or blastic lesions are identified.  IMPRESSION: No evidence for acute  abnormality.   Electronically Signed   By: Shon Hale M.D.   On: 03/17/2014 17:57   Ct Renal Stone Study  03/17/2014    CLINICAL DATA:  Low back pain radiating into the right lower quadrant with nausea and vomiting. Symptoms began earlier today.  EXAM: CT ABDOMEN AND PELVIS WITHOUT CONTRAST  TECHNIQUE: Multidetector CT imaging of the abdomen and pelvis was performed following the standard protocol without IV contrast.  COMPARISON:  Abdominal ultrasound 05/15/2004  FINDINGS: There is a 4 mm nodule in the left lower lobe (series 205, image 9). Trace right pleural effusion is noted.  There is a large, complex fat containing mass in the right retroperitoneum measuring approximately 11.7 x 7.1 x 21.5 cm. There are complex internal soft tissue components. The lesion extends from the level of the right hemidiaphragm inferiorly to just below the  lower pole of the right kidney. The mass is predominantly located posterior to the right kidney, which is markedly displaced anteriorly and flattened, however the mass also appears to partially encircle of the kidney. There is associated moderate volume acute retroperitoneal hemorrhage. The right adrenal gland is not clearly identified.  There is mass effect on the right lobe of the liver by the retroperitoneal mass. No focal liver lesion is identified. Gallbladder, spleen, left adrenal gland, left kidney, and pancreas have an unremarkable unenhanced appearance.  The small and large bowel are nondilated without evidence of obstruction. The bladder is largely decompressed. Appendix is unremarkable. The infrahepatic IVC is displaced and moderately flattened by the right-sided retroperitoneal tumor. Minimal infra renal abdominal aortic atherosclerotic calcification is noted. No enlarged lymph nodes are identified. 1.6 cm dense sclerotic focus in the right sacrum likely represents a bone island. Diffuse flowing osteophytes in the visualized lower thoracic spine may represent DISH. Prior instrumented posterior fusion has been performed from T12-L2.  IMPRESSION: 1. Large, complex fat containing  right-sided retroperitoneal mass with associated acute retroperitoneal hemorrhage. This may represent a renal angiomyolipoma which has hemorrhaged, however other fat containing retroperitoneal masses such as liposarcoma are also considerations. 2. Trace right pleural effusion. 3. 4 mm left lower lobe lung nodule. If the patient is at high risk for bronchogenic carcinoma, follow-up chest CT at 1 year is recommended. If the patient is at low risk, no follow-up is needed. This recommendation follows the consensus statement: Guidelines for Management of Small Pulmonary Nodules Detected on CT Scans: A Statement from the [place] as published in Radiology 2005; 237:395-400. Critical Value/emergent results were called by telephone at the time of interpretation on 03/17/2014 at 7:04 pm to Dr. Audie Pinto, who verbally acknowledged these results.   Electronically Signed   By: Logan Bores   On: 03/17/2014 19:05     EKG Interpretation   Date/Time:  Sunday March 17 2014 16:11:39 EST Ventricular Rate:  84 PR Interval:  177 QRS Duration: 90 QT Interval:  374 QTC Calculation: 442 R Axis:   116 Text Interpretation:  Age not entered, assumed to be  48 years old for  purpose of ECG interpretation Sinus or ectopic atrial rhythm Left atrial  enlargement Right axis deviation No previous tracing Confirmed by BEATON   MD, ROBERT ([id number]) on 03/17/2014 4:16:24 PM      MDM   Final diagnoses:  Right renal mass    48 year old male presenting with back pain. Initially on his presentation the patient complained of back pain that came in waves , with lower back tenderness. He had [place] history of back injury and back surgery, and has had intermittent problems with back pain since then. Asian initially was given Valium and fentanyl with the leading differential that this might be a back spasm.  X-rays did not show any dislodgment of his hardware.  While being observed, he began to develop abdominal pain nausea and  vomiting. He was reexamined and had developed abdominal  Tenderness with guarding.  Labs were obtained as well as a CT of his abdomen. Laboratory studies notable for leukocytosis, metabolic acidosis, likely due to his elevated lactate, acute kidney injury,   likely prerenal.  CT notable for large hemorrhagic mass in the retroperitoneum which is likely the etiology of this pain. Urologist she has been consulted as this is likely nephrogenic in origin, and the patient does report a history of having a renal mass in the past.   Dr. Alinda Money has seen  and evaluated the patient, requests admission at [facility]. Patient will be transferred , reassessed and stable condition.  Leata Mouse, MD 03/17/14 2493  Leata Mouse, MD 03/18/14 2419  Dot Lanes, MD 03/23/14 [phone]

## 2014-03-18 DIAGNOSIS — R58 Hemorrhage, not elsewhere classified: Secondary | ICD-10-CM | POA: Insufficient documentation

## 2014-03-18 DIAGNOSIS — D1771 Benign lipomatous neoplasm of kidney: Secondary | ICD-10-CM | POA: Insufficient documentation

## 2014-03-18 LAB — CBC
HCT: 25.4 % — ABNORMAL LOW (ref 39.0–52.0)
HCT: 28 % — ABNORMAL LOW (ref 39.0–52.0)
HCT: 34 % — ABNORMAL LOW (ref 39.0–52.0)
HEMATOCRIT: 30.7 % — AB (ref 39.0–52.0)
HEMOGLOBIN: 11.6 g/dL — AB (ref 13.0–17.0)
Hemoglobin: 10.5 g/dL — ABNORMAL LOW (ref 13.0–17.0)
Hemoglobin: 9.5 g/dL — ABNORMAL LOW (ref 13.0–17.0)
Hemoglobin: 8.7 g/dL — ABNORMAL LOW (ref 13.0–17.0)
MCH: 31.1 pg (ref 26.0–34.0)
MCH: 31.3 pg (ref 26.0–34.0)
MCH: 31.3 pg (ref 26.0–34.0)
MCH: 31.5 pg (ref 26.0–34.0)
MCHC: 33.9 g/dL (ref 30.0–36.0)
MCHC: 34.1 g/dL (ref 30.0–36.0)
MCHC: 34.2 g/dL (ref 30.0–36.0)
MCHC: 34.3 g/dL (ref 30.0–36.0)
MCV: 91.2 fL (ref 78.0–100.0)
MCV: 91.4 fL (ref 78.0–100.0)
MCV: 92 fL (ref 78.0–100.0)
MCV: 92.1 fL (ref 78.0–100.0)
PLATELETS: 182 10*3/uL (ref 150–400)
PLATELETS: 172 10*3/uL (ref 150–400)
Platelets: 159 10*3/uL (ref 150–400)
Platelets: 215 10*3/uL (ref 150–400)
RBC: 2.76 MIL/uL — AB (ref 4.22–5.81)
RBC: 3.04 MIL/uL — ABNORMAL LOW (ref 4.22–5.81)
RBC: 3.36 MIL/uL — ABNORMAL LOW (ref 4.22–5.81)
RBC: 3.73 MIL/uL — AB (ref 4.22–5.81)
RDW: 12.2 % (ref 11.5–15.5)
RDW: 12.3 % (ref 11.5–15.5)
RDW: 12.4 % (ref 11.5–15.5)
RDW: 12.4 % (ref 11.5–15.5)
WBC: 12 10*3/uL — AB (ref 4.0–10.5)
WBC: 13.9 10*3/uL — AB (ref 4.0–10.5)
WBC: 14.7 10*3/uL — ABNORMAL HIGH (ref 4.0–10.5)
WBC: 18.1 10*3/uL — ABNORMAL HIGH (ref 4.0–10.5)

## 2014-03-18 LAB — PROTIME-INR
INR: 1.09 (ref 0.00–1.49)
PROTHROMBIN TIME: 14.2 s (ref 11.6–15.2)

## 2014-03-18 LAB — BASIC METABOLIC PANEL
Anion gap: 14 (ref 5–15)
BUN: 33 mg/dL — AB (ref 6–23)
CALCIUM: 8.5 mg/dL (ref 8.4–10.5)
CHLORIDE: 103 meq/L (ref 96–112)
CO2: 22 mEq/L (ref 19–32)
Creatinine, Ser: 1.98 mg/dL — ABNORMAL HIGH (ref 0.50–1.35)
GFR calc non Af Amer: 38 mL/min — ABNORMAL LOW (ref >90)
GFR, EST AFRICAN AMERICAN: 44 mL/min — AB (ref >90)
Glucose, Bld: 158 mg/dL — ABNORMAL HIGH (ref 70–99)
Potassium: 4.7 mEq/L (ref 3.7–5.3)
Sodium: 139 mEq/L (ref 137–147)

## 2014-03-18 MED ORDER — ALBUTEROL SULFATE (2.5 MG/3ML) 0.083% IN NEBU: 2.5000 mg | INHALATION_SOLUTION | Freq: Four times a day (QID) | RESPIRATORY_TRACT | Status: DC | PRN

## 2014-03-18 MED ORDER — SODIUM CHLORIDE 0.9 % IV SOLN
INTRAVENOUS | Status: DC
  Administered 2014-03-18 – 2014-03-19 (×4): via INTRAVENOUS

## 2014-03-18 NOTE — H&P (Signed)
Reason for Consult: Retroperitoneal hemorrhage Chief Complaint: Chief Complaint  Patient presents with  . Back Pain    Referring Physician(s): Dr. Louis Meckel  History of Present Illness: [name] is a 48 y.o. male who presented 12/13 after acute right lower back pain after doing yard work. He admits to nausea and tingling sensation all over approximately 2 hours post yard work. He also c/o right sided abdominal "tightness" and pain over his right flank with inspiration. He had imaging which revealed a large right sided RP mass with associated hemorrhage. He states his pain is currently 3/10 and improved with no pain during breathing. He denies any current N/V. He denies any known history of kidney dysfunction and admits to history of right AML which has been followed by imaging in previous years, last report the patient has in his home file is from 2005. He denies any active CP or shortness of breath.   Past Medical History  Diagnosis Date  . GERD (gastroesophageal reflux disease)   . Seasonal allergies   . Pneumonia   . Pleural effusion, left   . Melanoma     Past Surgical History  Procedure Laterality Date  . Spinal fusion    . Cervical discectomy      Allergies: Review of patient's allergies indicates no known allergies.  Medications: Prior to Admission medications   Medication Sig Start Date End Date Taking? Authorizing Provider  omeprazole (PRILOSEC OTC) 20 MG tablet Take 20 mg by mouth daily.   Yes Historical Provider, MD  albuterol (PROVENTIL HFA;VENTOLIN HFA) 108 (90 BASE) MCG/ACT inhaler Inhale 1-2 puffs into the lungs every 6 (six) hours as needed for wheezing. 06/06/11 06/05/12  Melynda Ripple, MD  fluticasone (FLONASE) 50 MCG/ACT nasal spray Place 2 sprays into the nose daily. 06/06/11 06/05/12  Melynda Ripple, MD  Pseudoephedrine-Guaifenesin (MUCINEX D) [phone] MG TB12 Take 1 tablet by mouth 2 (two) times daily as needed (congestion). Patient not taking:  Reported on 03/17/2014 06/06/11   Melynda Ripple, MD    No family history on file.  History   Social History  . Marital Status: Married    Spouse Name: N/A    Number of Children: N/A  . Years of Education: N/A   Social History Main Topics  . Smoking status: Former Research scientist (life sciences)  . Smokeless tobacco: None  . Alcohol Use: Yes     Comment: Occasional  . Drug Use: No  . Sexual Activity: None   Other Topics Concern  . None   Social History Narrative   Review of Systems: A 12 point ROS discussed and pertinent positives are indicated in the HPI above.  All other systems are negative.  Review of Systems  Vital Signs: BP 119/79 mmHg  Pulse 93  Temp(Src) 98.4 F (36.9 C) (Oral)  Resp 20  Ht 6\' 1"  (1.854 m)  Wt 196 lb 12.8 oz (89.268 kg)  BMI 25.97 kg/m2  SpO2 100%  Physical Exam  Constitutional: He is oriented to person, place, and time. No distress.  HENT:  Head: Normocephalic and atraumatic.  Neck: No tracheal deviation present.  Cardiovascular: Normal rate and regular rhythm.   Pulmonary/Chest: Effort normal and breath sounds normal.  Abdominal: Soft. Bowel sounds are normal. He exhibits no distension.  Right flank tenderness and RLQ tenderness  Neurological: He is alert and oriented to person, place, and time.  Skin: Skin is warm and dry. He is not diaphoretic.  Psychiatric: He has a normal mood and affect. His behavior is  normal. Thought content normal.    Imaging: Dg Chest 2 View  03/17/2014   CLINICAL DATA:  Acute right lower chest pain after raking leaves.  EXAM: CHEST  2 VIEW  COMPARISON:  July 06, 2010.  FINDINGS: The heart size and mediastinal contours are within normal limits. Both lungs are clear. No pneumothorax or pleural effusion is noted. Status post surgical posterior fusion of thoracolumbar junction.  IMPRESSION: No acute cardiopulmonary abnormality seen.   Electronically Signed   By: Sabino Dick M.D.   On: 03/17/2014 17:54   Dg Thoracic Spine 2  View  03/17/2014   CLINICAL DATA:  Pt states he was raking leaves and using leaf blower approximately 6 hours ago and within a couple of hours, he was unable to move; he's having right upper back pain, RLQ pain, and right lower chest pain; pt states he had a fusion at T12-L1 in 1993 and states he has h/o mild scoliosis.  EXAM: THORACIC SPINE - 2 VIEW  COMPARISON:  07/06/2010  FINDINGS: The patient has had previous cervical fusion and posterior fusion of T12-L2. There is a long-standing fracture of 1 of the transpedicular screws at L2. There are changes of diffuse idiopathic skeletal hyperostosis. No acute fracture or subluxation. No suspicious lytic or blastic lesions are identified.  IMPRESSION: No evidence for acute  abnormality.   Electronically Signed   By: Shon Hale M.D.   On: 03/17/2014 17:57   Ct Renal Stone Study  03/17/2014   CLINICAL DATA:  Low back pain radiating into the right lower quadrant with nausea and vomiting. Symptoms began earlier today.  EXAM: CT ABDOMEN AND PELVIS WITHOUT CONTRAST  TECHNIQUE: Multidetector CT imaging of the abdomen and pelvis was performed following the standard protocol without IV contrast.  COMPARISON:  Abdominal ultrasound 05/15/2004  FINDINGS: There is a 4 mm nodule in the left lower lobe (series 205, image 9). Trace right pleural effusion is noted.  There is a large, complex fat containing mass in the right retroperitoneum measuring approximately 11.7 x 7.1 x 21.5 cm. There are complex internal soft tissue components. The lesion extends from the level of the right hemidiaphragm inferiorly to just below the lower pole of the right kidney. The mass is predominantly located posterior to the right kidney, which is markedly displaced anteriorly and flattened, however the mass also appears to partially encircle of the kidney. There is associated moderate volume acute retroperitoneal hemorrhage. The right adrenal gland is not clearly identified.  There is mass effect on  the right lobe of the liver by the retroperitoneal mass. No focal liver lesion is identified. Gallbladder, spleen, left adrenal gland, left kidney, and pancreas have an unremarkable unenhanced appearance.  The small and large bowel are nondilated without evidence of obstruction. The bladder is largely decompressed. Appendix is unremarkable. The infrahepatic IVC is displaced and moderately flattened by the right-sided retroperitoneal tumor. Minimal infra renal abdominal aortic atherosclerotic calcification is noted. No enlarged lymph nodes are identified. 1.6 cm dense sclerotic focus in the right sacrum likely represents a bone island. Diffuse flowing osteophytes in the visualized lower thoracic spine may represent DISH. Prior instrumented posterior fusion has been performed from T12-L2.  IMPRESSION: 1. Large, complex fat containing right-sided retroperitoneal mass with associated acute retroperitoneal hemorrhage. This may represent a renal angiomyolipoma which has hemorrhaged, however other fat containing retroperitoneal masses such as liposarcoma are also considerations. 2. Trace right pleural effusion. 3. 4 mm left lower lobe lung nodule. If the patient is at high  risk for bronchogenic carcinoma, follow-up chest CT at 1 year is recommended. If the patient is at low risk, no follow-up is needed. This recommendation follows the consensus statement: Guidelines for Management of Small Pulmonary Nodules Detected on CT Scans: A Statement from the [place] as published in Radiology 2005; 237:395-400. Critical Value/emergent results were called by telephone at the time of interpretation on 03/17/2014 at 7:04 pm to Dr. Audie Pinto, who verbally acknowledged these results.   Electronically Signed   By: Logan Bores   On: 03/17/2014 19:05    Labs:  CBC:  Recent Labs  03/17/14 0032 03/17/14 1810 03/18/14 0600 03/18/14 1230  WBC 18.1* 19.5* 13.9* 14.7*  HGB 11.6* 14.7 10.5* 9.5*  HCT 34.0* 41.1 30.7* 28.0*   PLT 215 222 182 172    COAGS:  Recent Labs  03/18/14 0600  INR 1.09    BMP:  Recent Labs  03/17/14 1810 03/18/14 0600  NA 138 139  K 4.0 4.7  CL 95* 103  CO2 18* 22  GLUCOSE 166* 158*  BUN 23 33*  CALCIUM 9.6 8.5  CREATININE 1.36* 1.98*  GFRNONAA 60* 38*  GFRAA 69* 44*    LIVER FUNCTION TESTS:  Recent Labs  03/17/14 1810  BILITOT 0.9  AST 31  ALT 22  ALKPHOS 56  PROT 7.9  ALBUMIN 4.3    Assessment and Plan: Retroperitoneal bleed from right AML-last reported imaging per patient 2005 (3-3.5cm) H/H stable, vitals stable, pain improving Acute kidney injury Cr 1.9 Risks and Benefits discussed with the patient regarding angiogram and embolization in acute phase versus elective phase, if patient continues to be stable, IR would prefer to wait until outpatient when renal function stabilizes Cr 1.9 today and displacement of renal AML anatomy has normalized after RP hemorrhage has been absorbed. All of the patient's questions were answered today. IR will continue to follow in hospital in case procedure is needed and if remains stable and renal function improves, IR would like to see back in clinic in 2-4 weeks to discuss elective embolization.    Thank you for this interesting consult.  I greatly enjoyed meeting [name] and look forward to participating in their care.    I spent a total of 40 minutes face to face in clinical consultation, greater than 50% of which was counseling/coordinating care  Signed: Hedy Jacob 03/18/2014, 3:47 PM

## 2014-03-18 NOTE — Progress Notes (Signed)
Pt arrived to unit room 1508 via stretcher. VS taken pt A&O X4. Pt oriented to room  And callbell with no complications. Pt guide at bedside. Pain 5/10, general weakness. Initial assessment completed. Will continue to monitor throughout shift

## 2014-03-18 NOTE — Progress Notes (Signed)
38M admitted with retroperitoneal bleed from right AML.    Intv: Transferred to [facility] for admission STable overnight, remains tachycardic, BP stable.  UOP not recorded. Pain improved, more isolated to right flank Breathing/SOB significantly improved.  S: Filed Vitals:   03/17/14 2324 03/17/14 2357 03/18/14 0209 03/18/14 0406  BP:  132/75  130/93  Pulse: 129 131 123 108  Temp:  97.9 F (36.6 C)  98.5 F (36.9 C)  TempSrc:  Oral  Oral  Resp:  16  18  Height:  6\' 1"  (1.854 m)    Weight:  89.268 kg (196 lb 12.8 oz)    SpO2: 96% 100% 100% 100%    Intake/Output Summary (Last 24 hours) at 03/18/14 0800 Last data filed at 03/18/14 0700  Gross per 24 hour  Intake      0 ml  Output    300 ml  Net   -300 ml   NAD, lying in bed comfortably Abdomen soft, tender on right side Extremities are symmetric   Recent Labs  03/17/14 0032 03/17/14 1810 03/18/14 0600  WBC 18.1* 19.5* 13.9*  HGB 11.6* 14.7 10.5*  HCT 34.0* 41.1 30.7*    Recent Labs  03/17/14 1810 03/18/14 0600  NA 138 139  K 4.0 4.7  CL 95* 103  CO2 18* 22  GLUCOSE 166* 158*  BUN 23 33*  CREATININE 1.36* 1.98*  CALCIUM 9.6 8.5    Recent Labs  03/18/14 0600  INR 1.09   No results for input(s): LABURIN in the last 72 hours. No results found for this or any previous visit.   Imaging: Ct scan reviewed- large right retroperitoneal hemorrhage with associate fat containing lesion, does not definitively extend from kidney  Imp: Patient has known about his AML since being discharged from the [facility] in 2004.  THis was followed for awhile by a urologist in the [place] but he was told that it no longer needed to be followed.  At that time it measured ~3 cm.  It is also seen on a CT scan of his chest in 2006 which is in our system.  It clearly extends from the upper pole of his right kidney.  As such, I think with his history and images available to [place] we can safely assume that this is in fact an AML.  Unfortunately,  his creatinine prohibits contrast imaging today to further characterize the mass in its current state and whether there is active extravasation.  He appears to be relatively hemodynamically stable with improved pain control.  Plan:  Will discuss with IR need for additional imaging prior to embolization and whether patient needs to be placed on bicarb/NAC prior to procedure.  Would otherwise like to embolize lesion ASAP.  Will alert Dr. Osborne Casco to his hospitalization.

## 2014-03-19 LAB — CBC
HCT: 21.8 % — ABNORMAL LOW (ref 39.0–52.0)
HEMATOCRIT: 22.1 % — AB (ref 39.0–52.0)
HEMATOCRIT: 21.6 % — AB (ref 39.0–52.0)
HEMATOCRIT: 20.8 % — AB (ref 39.0–52.0)
HEMOGLOBIN: 7.4 g/dL — AB (ref 13.0–17.0)
Hemoglobin: 7.5 g/dL — ABNORMAL LOW (ref 13.0–17.0)
Hemoglobin: 7.5 g/dL — ABNORMAL LOW (ref 13.0–17.0)
Hemoglobin: 7.2 g/dL — ABNORMAL LOW (ref 13.0–17.0)
MCH: 31.9 pg (ref 26.0–34.0)
MCH: 31.6 pg (ref 26.0–34.0)
MCH: 31.6 pg (ref 26.0–34.0)
MCH: 32.1 pg (ref 26.0–34.0)
MCHC: 34.4 g/dL (ref 30.0–36.0)
MCHC: 33.9 g/dL (ref 30.0–36.0)
MCHC: 34.3 g/dL (ref 30.0–36.0)
MCHC: 34.6 g/dL (ref 30.0–36.0)
MCV: 92.8 fL (ref 78.0–100.0)
MCV: 93.2 fL (ref 78.0–100.0)
MCV: 92.3 fL (ref 78.0–100.0)
MCV: 92.9 fL (ref 78.0–100.0)
Platelets: 128 10*3/uL — ABNORMAL LOW (ref 150–400)
Platelets: 128 10*3/uL — ABNORMAL LOW (ref 150–400)
Platelets: 126 10*3/uL — ABNORMAL LOW (ref 150–400)
Platelets: 129 10*3/uL — ABNORMAL LOW (ref 150–400)
RBC: 2.35 MIL/uL — ABNORMAL LOW (ref 4.22–5.81)
RBC: 2.37 MIL/uL — ABNORMAL LOW (ref 4.22–5.81)
RBC: 2.34 MIL/uL — AB (ref 4.22–5.81)
RBC: 2.24 MIL/uL — ABNORMAL LOW (ref 4.22–5.81)
RDW: 12.3 % (ref 11.5–15.5)
RDW: 12.3 % (ref 11.5–15.5)
RDW: 11.9 % (ref 11.5–15.5)
RDW: 12 % (ref 11.5–15.5)
WBC: 10.4 10*3/uL (ref 4.0–10.5)
WBC: 11.5 10*3/uL — AB (ref 4.0–10.5)
WBC: 11.1 10*3/uL — ABNORMAL HIGH (ref 4.0–10.5)
WBC: 9.7 10*3/uL (ref 4.0–10.5)

## 2014-03-19 LAB — BASIC METABOLIC PANEL
Anion gap: 10 (ref 5–15)
BUN: 24 mg/dL — AB (ref 6–23)
CALCIUM: 8.2 mg/dL — AB (ref 8.4–10.5)
CO2: 23 mEq/L (ref 19–32)
Chloride: 105 mEq/L (ref 96–112)
Creatinine, Ser: 1.3 mg/dL (ref 0.50–1.35)
GFR calc Af Amer: 73 mL/min — ABNORMAL LOW (ref >90)
GFR, EST NON AFRICAN AMERICAN: 63 mL/min — AB (ref >90)
GLUCOSE: 110 mg/dL — AB (ref 70–99)
POTASSIUM: 4.7 meq/L (ref 3.7–5.3)
SODIUM: 138 meq/L (ref 137–147)

## 2014-03-19 LAB — ABO/RH: ABO/RH(D): O POS

## 2014-03-19 MED ORDER — DEXTROSE-NACL 5-0.45 % IV SOLN: INTRAVENOUS | Status: DC

## 2014-03-19 MED ORDER — KCL IN DEXTROSE-NACL 20-5-0.45 MEQ/L-%-% IV SOLN
INTRAVENOUS | Status: DC
  Administered 2014-03-19: 18:00:00 via INTRAVENOUS
  Filled 2014-03-19 (×2): qty 1000

## 2014-03-19 MED ORDER — ACETAMINOPHEN 325 MG PO TABS
325.0000 mg | ORAL_TABLET | Freq: Four times a day (QID) | ORAL | Status: DC | PRN
  Administered 2014-03-19 – 2014-03-20 (×3): 325 mg via ORAL
  Filled 2014-03-19 (×3): qty 1

## 2014-03-19 NOTE — Progress Notes (Signed)
3M admitted with retroperitoneal bleed from right AML on 03/17/14.  Intv: Pain improved since admission, still complaining of soreness in right abdomen On bedrest NPO p MN  S: Filed Vitals:   03/18/14 2000 03/18/14 2339 03/19/14 0407 03/19/14 0500  BP: 119/70 109/68 116/68   Pulse: 95 112 122   Temp: 98.7 F (37.1 C) 99.1 F (37.3 C) 100.3 F (37.9 C) 99.9 F (37.7 C)  TempSrc: Oral Oral Oral Oral  Resp: 20 20 20    Height:      Weight:      SpO2: 100% 95% 94%     Intake/Output Summary (Last 24 hours) at 03/19/14 0720 Last data filed at 03/19/14 0645  Gross per 24 hour  Intake      0 ml  Output   1300 ml  Net  -1300 ml   NAD, lying in bed comfortably Abdomen soft, tender on right side Extremities are symmetric   Recent Labs  03/18/14 1230 03/18/14 1814 03/19/14 0510  WBC 14.7* 12.0* 10.4  HGB 9.5* 8.7* 7.5*  HCT 28.0* 25.4* 21.8*    Recent Labs  03/17/14 1810 03/18/14 0600 03/19/14 0402  NA 138 139 138  K 4.0 4.7 4.7  CL 95* 103 105  CO2 18* 22 23  GLUCOSE 166* 158* 110*  BUN 23 33* 24*  CREATININE 1.36* 1.98* 1.30  CALCIUM 9.6 8.5 8.2*    Recent Labs  03/18/14 0600  INR 1.09   No results for input(s): LABURIN in the last 72 hours. No results found for this or any previous visit.   Imaging: Ct scan reviewed- large right retroperitoneal hemorrhage with associate fat containing lesion, does not definitively extend from kidney  Imp: Patient has known about his AML since being discharged from the [place] in 2004.  THis was followed for awhile by a urologist in the [place] but he was told that it no longer needed to be followed.  At that time it measured ~3 cm.  It is also seen on a CT scan of his chest in 2006 which is in our system.  It clearly extends from the upper pole of his right kidney.  As such, I think with his history and images available to [place] we can safely assume that this is in fact an AML.   - Creatinine better today - hbg  continues to dwindle - tachycardia - BP stable  Plan:  Continue to trend Hgb.  Hoping to treat this conservatively, but if hgb continues to drift downward he may need intervention sooner.   Scheduled for q6 CBC, next lab due ~11am.  Continue bedrest and NPO status.  IR following.

## 2014-03-19 NOTE — Progress Notes (Addendum)
Patient ID: [name], male   DOB: 1965/08/07, 48 y.o.   MRN: [id number] Pt c/o frontal HA, some rt flank/pelvic discomfort; also states that he sometimes has difficulty taking deep breaths without causing pain. VSS; temp 99.6. Urine yellow, WBC 11.5  hgb 7.5( 8.7), creat 1.30. Findings d/w Dr. Kathlene Cote and he recommends CT A/P (renal mass protocol) on 12/16 as long as renal fxn nl. Above d/w pt/Dr. Louis Meckel. Will cont to monitor. Will resume diet today and make NPO after midnight in case further intervention needed.

## 2014-03-20 ENCOUNTER — Encounter (HOSPITAL_COMMUNITY): Payer: Self-pay | Admitting: Radiology

## 2014-03-20 ENCOUNTER — Ambulatory Visit (HOSPITAL_COMMUNITY): Payer: 59

## 2014-03-20 LAB — BASIC METABOLIC PANEL
Anion gap: 7 (ref 5–15)
BUN: 10 mg/dL (ref 6–23)
CALCIUM: 8.3 mg/dL — AB (ref 8.4–10.5)
CO2: 26 meq/L (ref 19–32)
Chloride: 104 mEq/L (ref 96–112)
Creatinine, Ser: 0.93 mg/dL (ref 0.50–1.35)
GFR calc Af Amer: >90 mL/min (ref >90)
GFR calc non Af Amer: >90 mL/min (ref >90)
GLUCOSE: 123 mg/dL — AB (ref 70–99)
Potassium: 4.4 mEq/L (ref 3.7–5.3)
Sodium: 137 mEq/L (ref 137–147)

## 2014-03-20 LAB — PREPARE RBC (CROSSMATCH)

## 2014-03-20 MED ORDER — OXYCODONE HCL 5 MG PO TABS: 5.0000 mg | ORAL_TABLET | ORAL | Status: DC | PRN

## 2014-03-20 MED ORDER — IOHEXOL 300 MG/ML  SOLN
100.0000 mL | Freq: Once | INTRAMUSCULAR | Status: AC | PRN
  Administered 2014-03-20: 100 mL via INTRAVENOUS

## 2014-03-20 MED ORDER — SODIUM CHLORIDE 0.9 % IV SOLN: Freq: Once | INTRAVENOUS | Status: DC

## 2014-03-20 MED ORDER — DSS 100 MG PO CAPS: 100.0000 mg | ORAL_CAPSULE | Freq: Two times a day (BID) | ORAL | Status: DC

## 2014-03-20 NOTE — Progress Notes (Signed)
59M admitted with retroperitoneal bleed from right AML on 03/17/14.  Intv: hgb stabilized Pain controlled  S: Filed Vitals:   03/19/14 1500 03/19/14 1859 03/19/14 2113 03/20/14 0520  BP: 130/74 117/68 127/65 120/69  Pulse: 112 116 114 98  Temp: 99.6 F (37.6 C) 100 F (37.8 C) 100.1 F (37.8 C) 98.4 F (36.9 C)  TempSrc: Oral Oral Oral Oral  Resp: 16 20 18 16   Height:      Weight:      SpO2: 97% 98% 99% 90%    Intake/Output Summary (Last 24 hours) at 03/20/14 0655 Last data filed at 03/20/14 0515  Gross per 24 hour  Intake  782.5 ml  Output   1201 ml  Net -418.5 ml   NAD, lying in bed comfortably Abdomen soft, tender on right side Extremities are symmetric   Recent Labs  03/19/14 1055 03/19/14 1702 03/19/14 2305  WBC 11.5* 11.1* 9.7  HGB 7.5* 7.4* 7.2*  HCT 22.1* 21.6* 20.8*    Recent Labs  03/18/14 0600 03/19/14 0402 03/20/14 0525  NA 139 138 137  K 4.7 4.7 4.4  CL 103 105 104  CO2 22 23 26   GLUCOSE 158* 110* 123*  BUN 33* 24* 10  CREATININE 1.98* 1.30 0.93  CALCIUM 8.5 8.2* 8.3*    Recent Labs  03/18/14 0600  INR 1.09   No results for input(s): LABURIN in the last 72 hours. No results found for this or any previous visit.   Imaging: Ct scan reviewed- large right retroperitoneal hemorrhage with associate fat containing lesion, does not definitively extend from kidney  Imp: Patient has known about his AML since being discharged from the [place] in 2004.  THis was followed for awhile by a urologist in the [place] but he was told that it no longer needed to be followed.  At that time it measured ~3 cm.  It is also seen on a CT scan of his chest in 2006 which is in our system.  It clearly extends from the upper pole of his right kidney.  As such, I think with his history and images available to [place] we can safely assume that this is in fact an AML.   - anemia from acute blood loss - stabilized - pain better - creatinine normalized  Plan:  -  Renal mass CT this AM - Expect he can be discharged this AM - Will follow-up with patient in clinic in 6 weeks - plan to reimage and consider definitive therapy via IR or partial nephrectomy in early March - patient has scheduled trip to [place] at the end of March.

## 2014-03-20 NOTE — Discharge Instructions (Signed)
   Activity:  You are encouraged to ambulate frequently (about every hour during waking hours) to help prevent blood clots from forming in your legs or lungs.  However, you should not engage in any heavy lifting (> 10-15 lbs), strenuous activity, or straining.   Diet: You should advance your diet as instructed by your physician.  It will be normal to have some bloating, nausea, and abdominal discomfort intermittently.   Prescriptions:  You will be provided a prescription for pain medication to take as needed.  If your pain is not severe enough to require the prescription pain medication, you may take extra strength Tylenol instead which will have less side effects.  You should also take a prescribed stool softener to avoid straining with bowel movements as the prescription pain medication may constipate you.   What to call us about: You should call the office ([phone]) if you develop fever > 101 or develop persistent vomiting. Activity:  You are encouraged to ambulate frequently (about every hour during waking hours) to help prevent blood clots from forming in your legs or lungs.  However, you should not engage in any heavy lifting (> 10-15 lbs), strenuous activity, or straining.  

## 2014-03-20 NOTE — Progress Notes (Signed)
Pt temp 100.2. MD Will Baltazar Najjar notified. MD is ok with giving the blood at this time.

## 2014-03-21 ENCOUNTER — Inpatient Hospital Stay (HOSPITAL_COMMUNITY): Payer: 59

## 2014-03-21 LAB — TYPE AND SCREEN
ABO/RH(D): O POS
ANTIBODY SCREEN: NEGATIVE
Unit division: 0
Unit division: 0

## 2014-03-21 LAB — HEMOGLOBIN AND HEMATOCRIT, BLOOD
HEMATOCRIT: 27.2 % — AB (ref 39.0–52.0)
Hemoglobin: 9.2 g/dL — ABNORMAL LOW (ref 13.0–17.0)

## 2014-03-21 MED ORDER — FENTANYL CITRATE 0.05 MG/ML IJ SOLN
INTRAMUSCULAR | Status: AC
  Filled 2014-03-21: qty 4

## 2014-03-21 MED ORDER — LIDOCAINE HCL 1 % IJ SOLN
INTRAMUSCULAR | Status: AC
  Filled 2014-03-21: qty 20

## 2014-03-21 MED ORDER — HYDROMORPHONE HCL 2 MG/ML IJ SOLN
INTRAMUSCULAR | Status: AC
  Filled 2014-03-21: qty 1

## 2014-03-21 MED ORDER — HYDROCODONE-ACETAMINOPHEN 5-325 MG PO TABS: 1.0000 | ORAL_TABLET | Freq: Four times a day (QID) | ORAL | Status: DC | PRN

## 2014-03-21 MED ORDER — ACETAMINOPHEN 500 MG PO TABS: 1000.0000 mg | ORAL_TABLET | Freq: Four times a day (QID) | ORAL | Status: DC | PRN

## 2014-03-21 MED ORDER — IOHEXOL 300 MG/ML  SOLN
80.0000 mL | Freq: Once | INTRAMUSCULAR | Status: AC | PRN
  Administered 2014-03-21: 1 mL via INTRA_ARTERIAL

## 2014-03-21 MED ORDER — SODIUM CHLORIDE 0.9 % IV SOLN
INTRAVENOUS | Status: DC
  Administered 2014-03-21: 12:00:00 via INTRAVENOUS

## 2014-03-21 MED ORDER — SODIUM BICARBONATE 8.4 % IV SOLN
INTRAVENOUS | Status: DC
  Administered 2014-03-21 – 2014-03-22 (×2): via INTRAVENOUS
  Filled 2014-03-21 (×5): qty 150

## 2014-03-21 MED ORDER — MIDAZOLAM HCL 2 MG/2ML IJ SOLN
INTRAMUSCULAR | Status: AC
  Filled 2014-03-21: qty 6

## 2014-03-21 MED ORDER — FENTANYL CITRATE 0.05 MG/ML IJ SOLN
INTRAMUSCULAR | Status: AC | PRN
  Administered 2014-03-21: 25 ug via INTRAVENOUS
  Administered 2014-03-21: 50 ug via INTRAVENOUS

## 2014-03-21 MED ORDER — OXYCODONE HCL 5 MG PO TABS
5.0000 mg | ORAL_TABLET | ORAL | Status: DC | PRN
  Administered 2014-03-22: 10 mg via ORAL
  Filled 2014-03-21: qty 2

## 2014-03-21 MED ORDER — MIDAZOLAM HCL 2 MG/2ML IJ SOLN
INTRAMUSCULAR | Status: AC | PRN
  Administered 2014-03-21 (×4): 0.5 mg via INTRAVENOUS
  Administered 2014-03-21 (×2): 1 mg via INTRAVENOUS

## 2014-03-21 MED ORDER — KCL IN DEXTROSE-NACL 20-5-0.45 MEQ/L-%-% IV SOLN
INTRAVENOUS | Status: DC
  Administered 2014-03-21: 09:00:00 via INTRAVENOUS
  Filled 2014-03-21 (×2): qty 1000

## 2014-03-21 MED ORDER — HYDROMORPHONE HCL 1 MG/ML IJ SOLN
INTRAMUSCULAR | Status: AC | PRN
  Administered 2014-03-21 (×2): 1 mg via INTRAVENOUS

## 2014-03-21 NOTE — Progress Notes (Signed)
Subjective: Pt feeling better today since transfusion; still with mild rt flank discomfort, mild dyspnea with exertion  Objective: Vital signs in last 24 hours: Temp:  [99.1 F (37.3 C)-100.3 F (37.9 C)] 99.2 F (37.3 C) (12/17 0504) Pulse Rate:  [73-114] 97 (12/17 0504) Resp:  [16-20] 20 (12/17 0504) BP: (128-144)/(68-85) 137/75 mmHg (12/17 0504) SpO2:  [96 %-100 %] 99 % (12/17 0504) Last BM Date: 03/20/14  Intake/Output from previous day: 12/16 0701 - 12/17 0700 In: 1034 [P.O.:360; Blood:674] Out: 450 [Urine:450] Intake/Output this shift:    Pt awake/alert; chest- dim BS rt base, left clear; heart- RRR; abd- soft,+BS, mild rt CVA tenderness, mild lower abd tenderness; ext- FROM, no edema, intact distal pulses  Lab Results:   Recent Labs  03/19/14 1702 03/19/14 2305 03/21/14 0535  WBC 11.1* 9.7  --   HGB 7.4* 7.2* 9.2*  HCT 21.6* 20.8* 27.2*  PLT 126* 129*  --    BMET  Recent Labs  03/19/14 0402 03/20/14 0525  NA 138 137  K 4.7 4.4  CL 105 104  CO2 23 26  GLUCOSE 110* 123*  BUN 24* 10  CREATININE 1.30 0.93  CALCIUM 8.2* 8.3*   PT/INR No results for input(s): LABPROT, INR in the last 72 hours. ABG No results for input(s): PHART, HCO3 in the last 72 hours.  Invalid input(s): PCO2, PO2  Studies/Results: Ct Abdomen Pelvis W Wo Contrast  03/20/2014   ADDENDUM REPORT: 03/20/2014 17:20  ADDENDUM: Again, as was stated on prior noncontrast abdominal CT performed 03/17/2014, the assumption is that this largely fat containing right-sided retroperitoneal mass represents an AML though as mass has significantly enlarged since 2006 examination (previously 4.2 cm, currently, approximately 13.1 cm) more aggressive etiologies (such as a liposarcoma) should be considered as additional diagnostic considerations.   Electronically Signed   By: Sandi Mariscal M.D.   On: 03/20/2014 17:20   03/20/2014   ADDENDUM REPORT: 03/20/2014 15:23  ADDENDUM: Critical Value/emergent  results were called by telephone at the time of interpretation on 03/20/2014 at 3:23 pm to Dr. Louis Meckel , who verbally acknowledged these results.   Electronically Signed   By: Sandi Mariscal M.D.   On: 03/20/2014 15:23   03/20/2014   CLINICAL DATA:  Evaluate renal AML.  EXAM: CT ABDOMEN AND PELVIS WITHOUT AND WITH CONTRAST  TECHNIQUE: Multidetector CT imaging of the abdomen and pelvis was performed following the standard protocol before and following the bolus administration of intravenous contrast.  CONTRAST:  13mL OMNIPAQUE IOHEXOL 300 MG/ML  SOLN  COMPARISON:  CT abdomen pelvis- 03/17/2014; chest CT - 05/15/2004  FINDINGS: Vascular Findings:  Abdominal aorta: Scattered minimal amount of mixed calcified and noncalcified atherosclerotic plaque within the distal aspect of the the normal caliber abdominal aorta, not resulting in hemodynamically significant stenosis. No abdominal aortic dissection or periaortic stranding.  Celiac artery: Widely patent without hemodynamically significant narrowing. Conventional branching pattern.  SMA: Widely patent without hemodynamically significant narrowing. Conventional branching pattern.  Right Renal artery: Solitary; widely patent without hemodynamically significant narrowing. A branch of the superior division of the right renal artery appears to be the solitary vascular supply to a large approximately 2.6 x 1.5 cm pseudoaneurysm within the central aspect of the large right-sided renal AML (representative axial images 35 through 41, series 4, coronal images 50 through 58, series 604).  Left Renal artery: Solitary ; widely patent without hemodynamically significant narrowing.  IMA: Widely patent without hemodynamically significant narrowing.  Pelvic vasculature: The proximal aspect of  the bilateral common iliac arteries are widely patent without hemodynamically significant narrowing.  Review of the MIP images confirms the above findings.    --------------------------------------------------------------------------------  Nonvascular Findings:  The previously identified complex fat containing right-sided renal angiomyolipoma appears similar to the prior examination though there has been minimal progression of both intra and extra lesional hemorrhage, especially about the caudal aspect of the right-sided the retroperitoneum. A minimal amount of evolving blood products are seen within the pelvic cul-de-sac.  While definitive measurements are difficult secondary to the concomitant intra and extra-lesional hemorrhage, the right renal angiomyolipoma measures at least 11.4 x 9.9 x 13.1 cm (as measured in greatest oblique axial - image 31, series 2 and coronal - image 59, series 602) dimensions respectively. Note, this right-sided renal angiomyolipoma appears increased in size since remote chest CT performed 05/15/2004 at which time it measured at least 4.2 x 4.1 cm  Note is made of an approximately 2.6 x 1.5 cm pseudoaneurysm within the central aspect of the angiomyolipoma (coronal image 59, series 604). There are no definitive areas of active extravasation.  There is homogeneous enhancement and excretion of the bilateral kidneys. No urinary obstruction. No discrete left-sided renal lesions. No renal stones.  There is mass effect of the right-sided retroperitoneal hemorrhage on the posterior aspect of the right lobe of the liver. Otherwise, normal hepatic contour. No discrete hepatic lesions. Normal appearance of the gallbladder. No radiopaque gallstones. There is a minimal amount of ascites tracking along the caudal aspect of the right lobe of the liver (image 71, series 4).  Normal appearance of the bilateral adrenal glands, pancreas and spleen.  Moderate colonic stool burden without evidence of enteric obstruction. Normal appearance of the retrocecal appendix. No pneumoperitoneum, pneumatosis or portal venous gas.  No definitive retroperitoneal,  mesenteric, pelvic or inguinal lymphadenopathy.  There is mild diffuse thickening of the urinary bladder wall, possibly accentuated due to underdistention. Prostate is borderline enlarged measuring 5.4 x 4.4 cm.  No acute or aggressive osseous abnormalities. Post T12-L2 paraspinal fusion. Stigmata DISH within the caudal aspect of the thoracic spine. Moderate DDD of L5-S1 with disc space height loss, endplate irregularity and sclerosis. Note is made of a prominent bone island within the right sacral ala. There is mild diffuse body wall anasarca. Small bilateral mesenteric fat containing inguinal hernias, left greater than right.  IMPRESSION: Slight progression of intra and extra lesional hemorrhage about the previously noted approximately 13.1 cm right-sided renal AML. Note is made of an approximately 2.6 x 1.5 cm pseudoaneurysm located within the central aspect of the AML. No definitive areas of active extravasation.  Critical Value/emergent results were called by telephone at the time of interpretation on 03/20/2014 at 2:09 pm to Dr. Louis Meckel , who verbally acknowledged these results.  Electronically Signed: By: Sandi Mariscal M.D. On: 03/20/2014 14:25    Anti-infectives: Anti-infectives    None      Assessment/Plan: Pt with right renal AML/central pseudoaneurysm, recent RP bleed; latest CT reviewed and case d/w Dr. Louis Meckel. Plan is for renal arteriogram with possible endovascular intervention today. Details/risks again d/w pt with his understanding and consent. Current hgb 9.2, latest creat .93.  LOS: 4 days    ALLRED,D [facility] 03/21/2014

## 2014-03-21 NOTE — Procedures (Signed)
Interventional Radiology Procedure Note  Procedure: Right renal angiogram, coil embolization of pseudo-aneurysm of right Angiomyolipoma, and particle embolization of right Angiomyolipoma Complications: No immediate Findings: 2.5cm Right AML pseudo-aneurysm. No appreciable flow to the right AML after embolization. No filling of the aneurysm. Recommendations:  - 6 hours Right leg straight for R CFA access.  Pressure used for hemostasis.  No closure device used.  - Monitor for post embolization syndrome    Signed,  Dulcy Fanny. Earleen Newport, DO

## 2014-03-21 NOTE — Progress Notes (Signed)
Pt returned to unit. Pt and femoral site assessed with nurse. No bleeding. Pt denies pain at this time, alert and verbal. Pt aware of restriction of lying flat x6 hrs. Pt complaint. Wife is at bedside and very supportive.

## 2014-03-21 NOTE — Progress Notes (Signed)
54M admitted with retroperitoneal bleed from right AML on 03/17/14.  Intv: CT scan confirms AML - large anuersym within lesion Received 2 units of PRBC yesterday - feels much better  S: Filed Vitals:   03/20/14 2312 03/20/14 2355 03/21/14 0226 03/21/14 0504  BP: 129/85 136/75 134/81 137/75  Pulse: 101 100 95 97  Temp: 99.2 F (37.3 C) 99.4 F (37.4 C) 99.1 F (37.3 C) 99.2 F (37.3 C)  TempSrc: Oral Oral Oral Oral  Resp: 18 18 18 20   Height:      Weight:      SpO2: 96% 98% 98% 99%    Intake/Output Summary (Last 24 hours) at 03/21/14 0920 Last data filed at 03/21/14 0200  Gross per 24 hour  Intake    914 ml  Output      0 ml  Net    914 ml   NAD, lying in bed comfortably Abdomen soft, tender on right side Extremities are symmetric   Recent Labs  03/19/14 1055 03/19/14 1702 03/19/14 2305 03/21/14 0535  WBC 11.5* 11.1* 9.7  --   HGB 7.5* 7.4* 7.2* 9.2*  HCT 22.1* 21.6* 20.8* 27.2*    Recent Labs  03/19/14 0402 03/20/14 0525  NA 138 137  K 4.7 4.4  CL 105 104  CO2 23 26  GLUCOSE 110* 123*  BUN 24* 10  CREATININE 1.30 0.93  CALCIUM 8.2* 8.3*   No results for input(s): LABPT, INR in the last 72 hours. No results for input(s): LABURIN in the last 72 hours. No results found for this or any previous visit.   Imaging: Ct scan reviewed- large right retroperitoneal hemorrhage with associate fat containing lesion, does not definitively extend from kidney  Imp: Patient has known about his AML since being discharged from the [facility] in 2004.  THis was followed for awhile by a urologist in the [facility] but he was told that it no longer needed to be followed.  At that time it measured ~3 cm.  It is also seen on a CT scan of his chest in 2006 which is in our system.  It clearly extends from the upper pole of his right kidney.  As such, I think with his history and images available to [place] we can safely assume that this is in fact an AML.   - anemia from acute blood  loss - stabilized, feels better after transfusion - pain better - risk of rebleed significant, as such would favor embolization.   Plan:  - have arranged with IR to embolize this lesion.   - will continue to monitor following embolization  Communicated this directly to patient.  We discussed various scenarios.  Together we have made this decision.

## 2014-03-21 NOTE — Sedation Documentation (Signed)
6Fr sheath removed from R fem artery by Dr. Earleen Newport.  Hemostasis achieved using manual pressure.  Groin level 0, 2+RDP.

## 2014-03-21 NOTE — Progress Notes (Signed)
Came to visit patient on behalf on [employer] to [employer] program for [employer] employees/dependents who have [employer]. Patient declines having any needs. Left Link to Wellness brochure at bedside. Made inpatient RNCM aware. [facility] [phone]

## 2014-03-21 NOTE — Sedation Documentation (Signed)
Gauze/tegaderm bandage placed to R fem art puncture, CDI, 2+RDP.

## 2014-03-22 LAB — CBC
HCT: 26.6 % — ABNORMAL LOW (ref 39.0–52.0)
HEMOGLOBIN: 9.2 g/dL — AB (ref 13.0–17.0)
MCH: 30.9 pg (ref 26.0–34.0)
MCHC: 34.6 g/dL (ref 30.0–36.0)
MCV: 89.3 fL (ref 78.0–100.0)
Platelets: 235 10*3/uL (ref 150–400)
RBC: 2.98 MIL/uL — AB (ref 4.22–5.81)
RDW: 12.1 % (ref 11.5–15.5)
WBC: 8.3 10*3/uL (ref 4.0–10.5)

## 2014-03-22 LAB — BASIC METABOLIC PANEL
ANION GAP: 12 (ref 5–15)
BUN: 7 mg/dL (ref 6–23)
CO2: 29 mEq/L (ref 19–32)
Calcium: 8.3 mg/dL — ABNORMAL LOW (ref 8.4–10.5)
Chloride: 95 mEq/L — ABNORMAL LOW (ref 96–112)
Creatinine, Ser: 0.88 mg/dL (ref 0.50–1.35)
GFR calc non Af Amer: >90 mL/min (ref >90)
Glucose, Bld: 130 mg/dL — ABNORMAL HIGH (ref 70–99)
POTASSIUM: 3.8 meq/L (ref 3.7–5.3)
SODIUM: 136 meq/L — AB (ref 137–147)

## 2014-03-22 NOTE — Discharge Summary (Signed)
Date of admission: 03/17/2014  Date of discharge: 03/22/2014  Admission diagnosis: right angiomyolipoma  Discharge diagnosis: as above, anemia from acute blood loss, s/p right AML embolization  Secondary diagnoses:  Patient Active Problem List   Diagnosis Date Noted  . Angiomyolipoma of kidney   . Retroperitoneal hemorrhage   . Right renal mass 03/17/2014    History and Physical: For full details, please see admission history and physical. Briefly, [name] is a 48 y.o. year old patient admitted with acute hemorrhage from a right kidney angiomyolipoma.    Hospital Course:  The patient was stabilized and transferred to the [facility] where he was admitted. Serial hemoglobins were then obtained and ultimately, the patient stopped bleeding on his own without any intervention. Once his creatinine had normalized a CT angiogram was obtained revealing a large aneurysm with in the large AML. Given the CT scan findings the decision was then made to urgently embolize the patient's tumor. This was performed by interventional radiology on 03/21/14. Postprocedure the patient did very well. There was no evidence of post embolization syndrome. The patient's pain was well-controlled with oral pain medication. His postprocedure labs were within normal limits with a normal creatinine and stable hemoglobin. He was tolerating a regular diet without significant nausea. He was voiding on his own. His bowels were moving. As such, the patient was deemed safe for discharge.    Laboratory values:   Recent Labs  03/19/14 1702 03/19/14 2305 03/21/14 0535 03/22/14 0546  WBC 11.1* 9.7  --  8.3  HGB 7.4* 7.2* 9.2* 9.2*  HCT 21.6* 20.8* 27.2* 26.6*    Recent Labs  03/20/14 0525 03/22/14 0546  NA 137 136*  K 4.4 3.8  CL 104 95*  CO2 26 29  GLUCOSE 123* 130*  BUN 10 7  CREATININE 0.93 0.88  CALCIUM 8.3* 8.3*   No results for input(s): LABPT, INR in the last 72 hours. No results for  input(s): LABURIN in the last 72 hours. No results found for this or any previous visit.  Disposition: Home  Discharge instruction: The patient was instructed to be ambulatory but told to refrain from heavy lifting, strenuous activity, or driving.   Discharge medications:   Medication List    STOP taking these medications        omeprazole 20 MG tablet  Commonly known as:  PRILOSEC OTC     Pseudoephedrine-Guaifenesin [phone] MG Tb12  Commonly known as:  MUCINEX D      TAKE these medications        albuterol 108 (90 BASE) MCG/ACT inhaler  Commonly known as:  PROVENTIL HFA;VENTOLIN HFA  Inhale 1-2 puffs into the lungs every 6 (six) hours as needed for wheezing.     DSS 100 MG Caps  Take 100 mg by mouth 2 (two) times daily.     fluticasone 50 MCG/ACT nasal spray  Commonly known as:  FLONASE  Place 2 sprays into the nose daily.     oxyCODONE 5 MG immediate release tablet  Commonly known as:  ROXICODONE  Take 1 tablet (5 mg total) by mouth every 4 (four) hours as needed.        Followup:      Follow-up Information    Follow up with Ardis Hughs, MD In 2 weeks.   Specialty:  Urology   Contact information:   [place] [id number] [phone]

## 2014-03-22 NOTE — Progress Notes (Signed)
Pt left at this time with his spouse at his side. Alert, oriented,and without c/o. Discharge instructions/prescriptions given/explained with pt verbalizing understanding.  Followup appointment noted.

## 2014-04-02 ENCOUNTER — Other Ambulatory Visit (HOSPITAL_COMMUNITY): Payer: Self-pay | Admitting: Urology

## 2014-04-02 DIAGNOSIS — D179 Benign lipomatous neoplasm, unspecified: Secondary | ICD-10-CM

## 2014-04-09 ENCOUNTER — Ambulatory Visit (HOSPITAL_COMMUNITY)
Admission: RE | Admit: 2014-04-09 | Discharge: 2014-04-09 | Disposition: A | Discharge status: Home / Self Care | Payer: 59 | Source: Ambulatory Visit | Attending: Urology | Admitting: Urology

## 2014-04-09 DIAGNOSIS — D1771 Benign lipomatous neoplasm of kidney: Secondary | ICD-10-CM | POA: Diagnosis present

## 2014-04-09 DIAGNOSIS — D179 Benign lipomatous neoplasm, unspecified: Secondary | ICD-10-CM

## 2014-04-09 DIAGNOSIS — R58 Hemorrhage, not elsewhere classified: Secondary | ICD-10-CM | POA: Insufficient documentation

## 2014-04-09 MED ORDER — GADOBENATE DIMEGLUMINE 529 MG/ML IV SOLN
20.0000 mL | Freq: Once | INTRAVENOUS | Status: AC | PRN
  Administered 2014-04-09: 18 mL via INTRAVENOUS

## 2014-04-17 ENCOUNTER — Telehealth: Payer: Self-pay | Admitting: Hematology

## 2014-04-17 NOTE — Telephone Encounter (Signed)
S/W PATIENT AND GAVE NP APPT FOR 01/15 @ 1:30 W/DR. FENG REFERRING DR. Consuella Lose DX- ESSENTIAL(HEMORRHAGIC) THROMBOCYTEMIA

## 2014-04-18 ENCOUNTER — Telehealth: Payer: Self-pay | Admitting: Hematology

## 2014-04-18 NOTE — Telephone Encounter (Signed)
S/W PATIENT AND GAVE NEW ARRIVAL TIME FOR NP APPT 01/15 @ 10:30 W/DR. FENG.

## 2014-04-19 ENCOUNTER — Ambulatory Visit (HOSPITAL_BASED_OUTPATIENT_CLINIC_OR_DEPARTMENT_OTHER): Payer: 59

## 2014-04-19 ENCOUNTER — Ambulatory Visit: Payer: 59

## 2014-04-19 ENCOUNTER — Ambulatory Visit (HOSPITAL_BASED_OUTPATIENT_CLINIC_OR_DEPARTMENT_OTHER): Payer: 59 | Admitting: Hematology

## 2014-04-19 ENCOUNTER — Ambulatory Visit: Payer: Managed Care, Other (non HMO)

## 2014-04-19 ENCOUNTER — Encounter: Payer: Self-pay | Admitting: Hematology

## 2014-04-19 ENCOUNTER — Telehealth: Payer: Self-pay | Admitting: Hematology

## 2014-04-19 ENCOUNTER — Ambulatory Visit: Payer: Managed Care, Other (non HMO) | Admitting: Hematology

## 2014-04-19 VITALS — BP 141/93 | HR 87 | Temp 97.4°F | Resp 18 | Ht 73.0 in | Wt 193.8 lb

## 2014-04-19 DIAGNOSIS — D500 Iron deficiency anemia secondary to blood loss (chronic): Secondary | ICD-10-CM

## 2014-04-19 DIAGNOSIS — D649 Anemia, unspecified: Secondary | ICD-10-CM

## 2014-04-19 DIAGNOSIS — D1771 Benign lipomatous neoplasm of kidney: Secondary | ICD-10-CM

## 2014-04-19 DIAGNOSIS — D3001 Benign neoplasm of right kidney: Secondary | ICD-10-CM

## 2014-04-19 DIAGNOSIS — R58 Hemorrhage, not elsewhere classified: Secondary | ICD-10-CM | POA: Diagnosis not present

## 2014-04-19 LAB — CBC & DIFF AND RETIC
BASO%: 1.1 % (ref 0.0–2.0)
BASOS ABS: 0.1 10*3/uL (ref 0.0–0.1)
EOS ABS: 0.4 10*3/uL (ref 0.0–0.5)
EOS%: 5.9 % (ref 0.0–7.0)
HEMATOCRIT: 39.5 % (ref 38.4–49.9)
HGB: 13.1 g/dL (ref 13.0–17.1)
Immature Retic Fract: 5.4 % (ref 3.00–10.60)
LYMPH%: 25.8 % (ref 14.0–49.0)
MCH: 29.4 pg (ref 27.2–33.4)
MCHC: 33.2 g/dL (ref 32.0–36.0)
MCV: 88.8 fL (ref 79.3–98.0)
MONO#: 0.7 10*3/uL (ref 0.1–0.9)
MONO%: 9 % (ref 0.0–14.0)
NEUT#: 4.4 10*3/uL (ref 1.5–6.5)
NEUT%: 58.2 % (ref 39.0–75.0)
Platelets: 269 10*3/uL (ref 140–400)
RBC: 4.45 10*6/uL (ref 4.20–5.82)
RDW: 13.6 % (ref 11.0–14.6)
RETIC CT ABS: 94.79 10*3/uL — AB (ref 34.80–93.90)
Retic %: 2.13 % — ABNORMAL HIGH (ref 0.80–1.80)
WBC: 7.5 10*3/uL (ref 4.0–10.3)
lymph#: 1.9 10*3/uL (ref 0.9–3.3)

## 2014-04-19 LAB — COMPREHENSIVE METABOLIC PANEL (CC13)
ALBUMIN: 4 g/dL (ref 3.5–5.0)
ALK PHOS: 81 U/L (ref 40–150)
ALT: 19 U/L (ref 0–55)
ANION GAP: 9 meq/L (ref 3–11)
AST: 19 U/L (ref 5–34)
BUN: 13.9 mg/dL (ref 7.0–26.0)
CHLORIDE: 104 meq/L (ref 98–109)
CO2: 27 meq/L (ref 22–29)
Calcium: 9.4 mg/dL (ref 8.4–10.4)
Creatinine: 0.9 mg/dL (ref 0.7–1.3)
EGFR: >90 mL/min/{1.73_m2} (ref >90)
GLUCOSE: 94 mg/dL (ref 70–140)
Potassium: 4.9 mEq/L (ref 3.5–5.1)
Sodium: 140 mEq/L (ref 136–145)
TOTAL PROTEIN: 7.7 g/dL (ref 6.4–8.3)
Total Bilirubin: 0.57 mg/dL (ref 0.20–1.20)

## 2014-04-19 LAB — FERRITIN CHCC: FERRITIN: 971 ng/mL — AB (ref 22–316)

## 2014-04-19 LAB — IRON AND TIBC CHCC
%SAT: 28 % (ref 20–55)
IRON: 72 ug/dL (ref 42–163)
TIBC: 260 ug/dL (ref 202–409)
UIBC: 189 ug/dL (ref 117–376)

## 2014-04-19 NOTE — Telephone Encounter (Signed)
Pt confirmed labs/ov per 01/15 POF, gave pt AVS... KJ, sent pt back for lab add on and sent msg to MD about order for CT chest..Marland KitchenMarland Kitchen

## 2014-04-20 ENCOUNTER — Encounter: Payer: Self-pay | Admitting: Hematology

## 2014-04-20 NOTE — Progress Notes (Addendum)
[facility]  [phone] Fax:([phone]) [place] Note   Patient Care Team: Haywood Pao, MD as PCP - General (Internal Medicine) 04/19/2014  CHIEF COMPLAINTS/PURPOSE OF CONSULTATION:  right kidney angiomyolipoma, anemia and mild thrombocytopenia    HISTORY OF PRESENTING ILLNESS:  [name] 49 y.o. male is here because of anemia, mild thrombocytopenia, and right kidney angiomyolipoma(AML) which was complicated by recent retroperitoneal hemorrhage.  His AML was discovered by image when he was in the [employer] in 2004. The image was obtained after some back injury. The tumor was about 2-3 cm, and he was recommended to follow-up only.   He presented to [facility] on [facility]2014 was worsening right-sided flank pain and the skin bruise. CT of abdomen showed a large right retroperitoneal mass with associated acute retroperitoneal hemorrhage. The mass is consistent with AML with hemorrhage. CT angiogram showed a large aneurysm within the large AML mass. His CBC on admission showed WBC 18.1 K, hemoglobin 11.6, hematocrit 34.0, MCV 91, platelets 215. His hemoglobin dropped to 7.5 on 03/19/2014, and the platelet count dropped to 120 K range. He was seen by urology service and underwent right kidney mass embolization on 03/21/2014. He was also given 2 units of RBC transfusion. He was stabilized and discharged home on 02/20/2014. His CBC on discharge showed WBC 8.3, hemoglobin 9.2, platelet count 235.  He has recovered well from his recent hospitalization. His right flank pain and bruise a near resolved. He denies any other new symptoms. He has good energy level and appetite. No recent weight loss.  MEDICAL HISTORY:  Past Medical History  Diagnosis Date  . GERD (gastroesophageal reflux disease)   . Seasonal allergies   . Pneumonia   . Pleural effusion, left   . Melanoma 2013, resected     SURGICAL HISTORY: Past Surgical History    Procedure Laterality Date  . Spinal fusion  1993  . Cervical discectomy  1998    SOCIAL HISTORY: History   Social History  . Marital Status: Married    Spouse Name: N/A    Number of Children: 1  . Years of Education: N/A   Occupational History  . IT computer    Social History Main Topics  . Smoking status: Former Smoker, quit 1990  . Smokeless tobacco: Not on file  . Alcohol Use: Yes     Comment: Occasional  . Drug Use: No  . Sexual Activity: Not on file   Other Topics Concern  . Not on file   Social History Narrative    FAMILY HISTORY: No family history of malignancy or blood disorders.  ALLERGIES:  is allergic to other.  MEDICATIONS:  Current Outpatient Prescriptions  Medication Sig Dispense Refill  . docusate sodium 100 MG CAPS Take 100 mg by mouth 2 (two) times daily. (Patient not taking: Reported on 04/19/2014) 60 capsule 0  . oxyCODONE (ROXICODONE) 5 MG immediate release tablet Take 1 tablet (5 mg total) by mouth every 4 (four) hours as needed. (Patient not taking: Reported on 04/19/2014) 15 tablet 0   No current facility-administered medications for this visit.    REVIEW OF SYSTEMS:   Constitutional: Denies fevers, chills or abnormal night sweats Eyes: Denies blurriness of vision, double vision or watery eyes Ears, nose, mouth, throat, and face: Denies mucositis or sore throat Respiratory: Denies cough, dyspnea or wheezes Cardiovascular: Denies palpitation, chest discomfort or lower extremity swelling Gastrointestinal:  Denies nausea, heartburn or change in bowel habits Skin: Denies  abnormal skin rashes Lymphatics: Denies new lymphadenopathy or easy bruising Neurological:Denies numbness, tingling or new weaknesses Behavioral/Psych: Mood is stable, no new changes  All other systems were reviewed with the patient and are negative.  PHYSICAL EXAMINATION: ECOG PERFORMANCE STATUS: 0 - Asymptomatic  Filed Vitals:   04/19/14 1040  BP: 141/93  Pulse: 87   Temp: 97.4 F (36.3 C)  Resp: 18   Filed Weights   04/19/14 1040  Weight: 193 lb 12.8 oz (87.907 kg)    GENERAL:alert, no distress and comfortable SKIN: skin color, texture, turgor are normal, no rashes or significant lesions EYES: normal, conjunctiva are pink and non-injected, sclera clear OROPHARYNX:no exudate, no erythema and lips, buccal mucosa, and tongue normal  NECK: supple, thyroid normal size, non-tender, without nodularity LYMPH:  no palpable lymphadenopathy in the cervical, axillary or inguinal LUNGS: clear to auscultation and percussion with normal breathing effort HEART: regular rate & rhythm and no murmurs and no lower extremity edema ABDOMEN:abdomen soft, non-tender and normal bowel sounds Musculoskeletal:no cyanosis of digits and no clubbing  PSYCH: alert & oriented x 3 with fluent speech NEURO: no focal motor/sensory deficits  LABORATORY DATA:  I have reviewed the data as listed CBC Latest Ref Rng 03/22/2014 03/21/2014 03/19/2014  WBC 4.0 - 10.5 K/uL 8.3 - 9.7  Hemoglobin 13.0 - 17.0 g/dL 9.2(L) 9.2(L) 7.2(L)  Hematocrit 39.0 - 52.0 % 26.6(L) 27.2(L) 20.8(L)  Platelets 150 - 400 K/uL 235 - 129(L)    CMP Latest Ref Rng 03/22/2014 03/20/2014 03/19/2014  Glucose 70 - 99 mg/dL 130(H) 123(H) 110(H)  BUN 6 - 23 mg/dL 7 10 24(H)  Creatinine 0.50 - 1.35 mg/dL 0.88 0.93 1.30  Sodium 137 - 147 mEq/L 136(L) 137 138  Potassium 3.7 - 5.3 mEq/L 3.8 4.4 4.7  Chloride 96 - 112 mEq/L 95(L) 104 105  CO2 19 - 32 mEq/L 29 26 23   Calcium 8.4 - 10.5 mg/dL 8.3(L) 8.3(L) 8.2(L)  Total Protein 6.0 - 8.3 g/dL - - -  Total Bilirubin 0.3 - 1.2 mg/dL - - -  Alkaline Phos 39 - 117 U/L - - -  AST 0 - 37 U/L - - -  ALT 0 - 53 U/L - - -      RADIOGRAPHIC STUDIES: I have personally reviewed the radiological images as listed and agreed with the findings in the report.  Ct Abdomen Pelvis W Wo Contrast 03/20/2014    ADDENDUM REPORT: 03/20/2014 17:20  ADDENDUM: Again, as was stated  on prior noncontrast abdominal CT performed 03/17/2014, the assumption is that this largely fat containing right-sided retroperitoneal mass represents an AML though as mass has significantly enlarged since 2006 examination (previously 4.2 cm, currently, approximately 13.1 cm) more aggressive etiologies (such as a liposarcoma) should be considered as additional diagnostic considerations.     IMPRESSION: Slight progression of intra and extra lesional hemorrhage about the previously noted approximately 13.1 cm right-sided renal AML. Note is made of an approximately 2.6 x 1.5 cm pseudoaneurysm located within the central aspect of the AML. No definitive areas of active extravasation.    Mr Abdomen W Wo Contrast 04/09/2014 IMPRESSION: Giant right renal angiomyolipoma shows decrease in associated retroperitoneal hemorrhage. Previously seen internal pseudoaneurysm is now thrombosed, although residual soft tissue enhancement is noted in the superior portion of this lesion.   Electronically Signed   By: Earle Gell M.D.   On: 04/09/2014 13:53   ASSESSMENT & PLAN:  49 year old Caucasian male  1. Anemia and mild thrombocytopenia -His anemia is likely  related to his acute hemorrhage from his right kidney AML. He denies prior history of anemia. He received 2 units of red blood cell transfusion and this responded appropriately. -We'll check his iron level, TIBC and ferritin to see if he has anemia of iron deficiency. -His mild thrombocytopenia is likely related to his acute hemorrhage, it has spontaneously resolved. -We'll follow-up his CBC. No further workup at this point.  2. Right kidney angiomyolipoma with acute hemorrhage -He is status post embolization due to the acute hemorrhage. -His MRI after embolization showed decreased right AML, with residual soft tissue enhancement in the superior portion of this lesion. -He will follow up with urologist Dr. Louis Meckel to decide if he needs surgery. -Giving his recent  episodes of acute hemorrhage, increased size of the tumor, I would favor partial nephrectomy to remove the tumor. -Also AML usually presents for interim course, metastasize is still possible. He needs to be followed, probably on yearly basis. He will decide if he will follow-up with Dr. Louis Meckel or me.  3. Pulmonary nodule and trace pleural effusion -he had pulmonary nodule since 2004, recent CT abd showed stable LLL nodule (20mm), likely benign. -some renal AML are associated with pulmonary lymphangioleiomyomatosis, will obtain a CT chest for further evaluation.    Follow-up, return in three months with a repeated CBC.  All questions were answered. The patient knows to call the clinic with any problems, questions or concerns. I spent 40 minutes counseling the patient face to face. The total time spent in the appointment was 55 minutes and more than 50% was on counseling.     Truitt Merle, MD 04/19/2014 11:11 AM

## 2014-04-20 NOTE — Addendum Note (Signed)
Addended by: Truitt Merle on: 04/20/2014 03:21 PM   Modules accepted: Orders

## 2014-04-29 ENCOUNTER — Encounter (HOSPITAL_COMMUNITY): Payer: Self-pay

## 2014-04-29 ENCOUNTER — Ambulatory Visit (HOSPITAL_COMMUNITY): Payer: 59

## 2014-04-29 ENCOUNTER — Ambulatory Visit (HOSPITAL_COMMUNITY)
Admission: RE | Admit: 2014-04-29 | Discharge: 2014-04-29 | Disposition: A | Discharge status: Home / Self Care | Payer: 59 | Source: Ambulatory Visit | Attending: Hematology | Admitting: Hematology

## 2014-04-29 DIAGNOSIS — R911 Solitary pulmonary nodule: Secondary | ICD-10-CM | POA: Diagnosis present

## 2014-04-29 DIAGNOSIS — J90 Pleural effusion, not elsewhere classified: Secondary | ICD-10-CM | POA: Diagnosis not present

## 2014-04-29 DIAGNOSIS — Z85820 Personal history of malignant melanoma of skin: Secondary | ICD-10-CM | POA: Insufficient documentation

## 2014-04-29 DIAGNOSIS — D1771 Benign lipomatous neoplasm of kidney: Secondary | ICD-10-CM | POA: Insufficient documentation

## 2014-04-29 DIAGNOSIS — D3001 Benign neoplasm of right kidney: Secondary | ICD-10-CM

## 2014-04-29 MED ORDER — IOHEXOL 300 MG/ML  SOLN
80.0000 mL | Freq: Once | INTRAMUSCULAR | Status: AC | PRN
  Administered 2014-04-29: 80 mL via INTRAVENOUS

## 2014-05-17 ENCOUNTER — Telehealth: Payer: Self-pay | Admitting: Hematology

## 2014-05-17 NOTE — Telephone Encounter (Signed)
FAXED MEDICAL RECORDS TO [facility]  FAX NUMBER: [phone]

## 2014-07-19 ENCOUNTER — Encounter: Payer: Self-pay | Admitting: Hematology

## 2014-07-19 ENCOUNTER — Other Ambulatory Visit (HOSPITAL_BASED_OUTPATIENT_CLINIC_OR_DEPARTMENT_OTHER): Payer: 59

## 2014-07-19 ENCOUNTER — Ambulatory Visit (HOSPITAL_BASED_OUTPATIENT_CLINIC_OR_DEPARTMENT_OTHER): Payer: 59 | Admitting: Hematology

## 2014-07-19 VITALS — BP 136/86 | HR 84 | Temp 98.3°F | Resp 18 | Ht 73.0 in | Wt 203.7 lb

## 2014-07-19 DIAGNOSIS — D3001 Benign neoplasm of right kidney: Secondary | ICD-10-CM

## 2014-07-19 DIAGNOSIS — D551 Anemia due to other disorders of glutathione metabolism: Secondary | ICD-10-CM | POA: Diagnosis not present

## 2014-07-19 DIAGNOSIS — J90 Pleural effusion, not elsewhere classified: Secondary | ICD-10-CM | POA: Diagnosis not present

## 2014-07-19 DIAGNOSIS — N2889 Other specified disorders of kidney and ureter: Secondary | ICD-10-CM

## 2014-07-19 DIAGNOSIS — D500 Iron deficiency anemia secondary to blood loss (chronic): Secondary | ICD-10-CM

## 2014-07-19 DIAGNOSIS — D696 Thrombocytopenia, unspecified: Secondary | ICD-10-CM

## 2014-07-19 DIAGNOSIS — D649 Anemia, unspecified: Secondary | ICD-10-CM | POA: Insufficient documentation

## 2014-07-19 LAB — CBC & DIFF AND RETIC
BASO%: 0.9 % (ref 0.0–2.0)
BASOS ABS: 0.1 10*3/uL (ref 0.0–0.1)
EOS%: 4.5 % (ref 0.0–7.0)
Eosinophils Absolute: 0.4 10*3/uL (ref 0.0–0.5)
HCT: 46.9 % (ref 38.4–49.9)
HGB: 16.1 g/dL (ref 13.0–17.1)
IMMATURE RETIC FRACT: 3.1 % (ref 3.00–10.60)
LYMPH#: 2.1 10*3/uL (ref 0.9–3.3)
LYMPH%: 25.6 % (ref 14.0–49.0)
MCH: 30.3 pg (ref 27.2–33.4)
MCHC: 34.3 g/dL (ref 32.0–36.0)
MCV: 88.3 fL (ref 79.3–98.0)
MONO#: 0.8 10*3/uL (ref 0.1–0.9)
MONO%: 9.9 % (ref 0.0–14.0)
NEUT#: 4.8 10*3/uL (ref 1.5–6.5)
NEUT%: 59.1 % (ref 39.0–75.0)
PLATELETS: 232 10*3/uL (ref 140–400)
RBC: 5.31 10*6/uL (ref 4.20–5.82)
RDW: 13.3 % (ref 11.0–14.6)
RETIC CT ABS: 77 10*3/uL (ref 34.80–93.90)
Retic %: 1.45 % (ref 0.80–1.80)
WBC: 8.1 10*3/uL (ref 4.0–10.3)
nRBC: 0 % (ref 0–0)

## 2014-07-19 LAB — COMPREHENSIVE METABOLIC PANEL (CC13)
ALT: 26 U/L (ref 0–55)
AST: 24 U/L (ref 5–34)
Albumin: 4.1 g/dL (ref 3.5–5.0)
Alkaline Phosphatase: 70 U/L (ref 40–150)
Anion Gap: 13 mEq/L — ABNORMAL HIGH (ref 3–11)
BUN: 14.1 mg/dL (ref 7.0–26.0)
CALCIUM: 7.4 mg/dL — AB (ref 8.4–10.4)
CHLORIDE: 105 meq/L (ref 98–109)
CO2: 23 mEq/L (ref 22–29)
Creatinine: 0.9 mg/dL (ref 0.7–1.3)
Glucose: 98 mg/dl (ref 70–140)
Potassium: 4.9 mEq/L (ref 3.5–5.1)
Sodium: 141 mEq/L (ref 136–145)
Total Bilirubin: 0.43 mg/dL (ref 0.20–1.20)
Total Protein: 7.4 g/dL (ref 6.4–8.3)

## 2014-07-19 NOTE — Progress Notes (Signed)
[place]  Telephone:(336) [phone] Fax:(336) [phone]  Clinic New Consult Note   Patient Care Team: Drenda Freeze, MD as PCP - General (Internal Medicine) 07/19/2014  CHIEF COMPLAINTS/PURPOSE OF CONSULTATION:  right kidney angiomyolipoma, anemia and mild thrombocytopenia    HISTORY OF PRESENTING ILLNESS:  [name] 49 y.o. male is here because of anemia, mild thrombocytopenia, and right kidney angiomyolipoma(AML) which was complicated by recent retroperitoneal hemorrhage.  His AML was discovered by image when he was in the [employer] in 2004. The image was obtained after some back injury. The tumor was about 2-3 cm, and he was recommended to follow-up only.   He presented to [facility] on 03/17/2014 was worsening right-sided flank pain and the skin bruise. CT of abdomen showed a large right retroperitoneal mass with associated acute retroperitoneal hemorrhage. The mass is consistent with AML with hemorrhage. CT angiogram showed a large aneurysm within the large AML mass. His CBC on admission showed WBC 18.1 K, hemoglobin 11.6, hematocrit 34.0, MCV 91, platelets 215. His hemoglobin dropped to 7.5 on 03/19/2014, and the platelet count dropped to 120 K range. He was seen by urology service and underwent right kidney mass embolization on 03/21/2014. He was also given 2 units of RBC transfusion. He was stabilized and discharged home on 02/20/2014. His CBC on discharge showed WBC 8.3, hemoglobin 9.2, platelet count 235.  He has recovered well from his recent hospitalization. His right flank pain and bruise a near resolved. He denies any other new symptoms. He has good energy level and appetite. No recent weight loss.  INTERIM HISTORY: [name] returns for follow up. He feels well overall, dyspnea resolved, he denies any pain, no bleeding. No other new complains.    MEDICAL HISTORY:  Past Medical History  Diagnosis Date  . GERD (gastroesophageal reflux disease)    . Seasonal allergies   . Pneumonia   . Pleural effusion, left   . Melanoma 2013, resected     SURGICAL HISTORY: Past Surgical History  Procedure Laterality Date  . Spinal fusion  1993  . Cervical discectomy  1998    SOCIAL HISTORY: History   Social History  . Marital Status: Married    Spouse Name: N/A    Number of Children: 1  . Years of Education: N/A   Occupational History  . IT computer    Social History Main Topics  . Smoking status: Former Smoker, quit 1990  . Smokeless tobacco: Not on file  . Alcohol Use: Yes     Comment: Occasional  . Drug Use: No  . Sexual Activity: Not on file   Other Topics Concern  . Not on file   Social History Narrative    FAMILY HISTORY: No family history of malignancy or blood disorders.  ALLERGIES:  is allergic to other.  MEDICATIONS:  Current Outpatient Prescriptions  Medication Sig Dispense Refill  . Multiple Vitamins-Minerals (MULTIVITAMIN GUMMIES ADULT PO) Take 1 each by mouth daily.    . naproxen sodium (ANAPROX) 220 MG tablet Take 220 mg by mouth 2 (two) times daily as needed.    Marland Kitchen oxyCODONE (ROXICODONE) 5 MG immediate release tablet Take 1 tablet (5 mg total) by mouth every 4 (four) hours as needed. (Patient not taking: Reported on 04/19/2014) 15 tablet 0   No current facility-administered medications for this visit.    REVIEW OF SYSTEMS:   Constitutional: Denies fevers, chills or abnormal night sweats Eyes: Denies blurriness of vision, double vision or watery eyes Ears,  nose, mouth, throat, and face: Denies mucositis or sore throat Respiratory: Denies cough, dyspnea or wheezes Cardiovascular: Denies palpitation, chest discomfort or lower extremity swelling Gastrointestinal:  Denies nausea, heartburn or change in bowel habits Skin: Denies abnormal skin rashes Lymphatics: Denies new lymphadenopathy or easy bruising Neurological:Denies numbness, tingling or new weaknesses Behavioral/Psych: Mood is stable, no new  changes  All other systems were reviewed with the patient and are negative.  PHYSICAL EXAMINATION: ECOG PERFORMANCE STATUS: 0 - Asymptomatic  Filed Vitals:   07/19/14 1256  BP: 136/86  Pulse: 84  Temp: 98.3 F (36.8 C)  Resp: 18   Filed Weights   07/19/14 1256  Weight: 203 lb 11.2 oz (92.398 kg)    GENERAL:alert, no distress and comfortable SKIN: skin color, texture, turgor are normal, no rashes or significant lesions EYES: normal, conjunctiva are pink and non-injected, sclera clear OROPHARYNX:no exudate, no erythema and lips, buccal mucosa, and tongue normal  NECK: supple, thyroid normal size, non-tender, without nodularity LYMPH:  no palpable lymphadenopathy in the cervical, axillary or inguinal LUNGS: clear to auscultation and percussion with normal breathing effort HEART: regular rate & rhythm and no murmurs and no lower extremity edema ABDOMEN:abdomen soft, non-tender and normal bowel sounds Musculoskeletal:no cyanosis of digits and no clubbing  PSYCH: alert & oriented x 3 with fluent speech NEURO: no focal motor/sensory deficits  LABORATORY DATA:  I have reviewed the data as listed CBC Latest Ref Rng 07/19/2014 04/19/2014 03/22/2014  WBC 4.0 - 10.3 10e3/uL 8.1 7.5 8.3  Hemoglobin 13.0 - 17.1 g/dL 16.1 13.1 9.2(L)  Hematocrit 38.4 - 49.9 % 46.9 39.5 26.6(L)  Platelets 140 - 400 10e3/uL 232 269 235    CMP Latest Ref Rng 04/19/2014 03/22/2014 03/20/2014  Glucose 70 - 140 mg/dl 94 130(H) 123(H)  BUN 7.0 - 26.0 mg/dL 13.9 7 10   Creatinine 0.7 - 1.3 mg/dL 0.9 0.88 0.93  Sodium 136 - 145 mEq/L 140 136(L) 137  Potassium 3.5 - 5.1 mEq/L 4.9 3.8 4.4  Chloride 96 - 112 mEq/L - 95(L) 104  CO2 22 - 29 mEq/L 27 29 26   Calcium 8.4 - 10.4 mg/dL 9.4 8.3(L) 8.3(L)  Total Protein 6.4 - 8.3 g/dL 7.7 - -  Total Bilirubin 0.20 - 1.20 mg/dL 0.57 - -  Alkaline Phos 40 - 150 U/L 81 - -  AST 5 - 34 U/L 19 - -  ALT 0 - 55 U/L 19 - -      RADIOGRAPHIC STUDIES: I have personally  reviewed the radiological images as listed and agreed with the findings in the report.  Ct Abdomen Pelvis W Wo Contrast 03/20/2014    ADDENDUM REPORT: 03/20/2014 17:20  ADDENDUM: Again, as was stated on prior noncontrast abdominal CT performed 03/17/2014, the assumption is that this largely fat containing right-sided retroperitoneal mass represents an AML though as mass has significantly enlarged since 2006 examination (previously 4.2 cm, currently, approximately 13.1 cm) more aggressive etiologies (such as a liposarcoma) should be considered as additional diagnostic considerations.     IMPRESSION: Slight progression of intra and extra lesional hemorrhage about the previously noted approximately 13.1 cm right-sided renal AML. Note is made of an approximately 2.6 x 1.5 cm pseudoaneurysm located within the central aspect of the AML. No definitive areas of active extravasation.    Mr Abdomen W Wo Contrast 04/09/2014 IMPRESSION: Giant right renal angiomyolipoma shows decrease in associated retroperitoneal hemorrhage. Previously seen internal pseudoaneurysm is now thrombosed, although residual soft tissue enhancement is noted in the superior portion of  this lesion.   Electronically Signed   By: Earle Gell M.D.   On: 04/09/2014 13:53   CT chest 04/29/2014 IMPRESSION: 1. No evidence of cystic lung disease. 2. Interval coiling of a hemorrhagic right renal angiomyolipoma, partially imaged.   ASSESSMENT & PLAN:  49 year old Caucasian male  1. Anemia and mild thrombocytopenia -His anemia is related to his acute hemorrhage from his right kidney AML. He denies prior history of anemia. He received 2 units of red blood cell transfusion and this responded appropriately. -His serum iron, TIBC and ferritin were normal -His mild thrombocytopenia is likely related to his acute hemorrhage, it has spontaneously resolved. -His CBC is normal today. No need further follow-up.  2. Right kidney angiomyolipoma with  acute hemorrhage -He is status post embolization due to the acute hemorrhage. -His MRI after embolization showed decreased right AML, with residual soft tissue enhancement in the superior portion of this lesion. -He will follow up with urologist Dr. Louis Meckel to decide if he needs surgery. -Giving his recent episodes of acute hemorrhage, increased size of the tumor, I would favor partial nephrectomy to remove the tumor. -Also AML usually presents for interim course, metastasize is still possible. He needs to be followed, probably on yearly basis. He will follow-up with Dr. Louis Meckel.  3. Pulmonary nodule and trace pleural effusion -he had pulmonary nodule since 2004, recent CT abd showed stable LLL nodule (73mm), likely benign. -some renal AML are associated with pulmonary lymphangioleiomyomatosis, CT chest in January 2016 was normal..    Follow-up, he will follow-up with his urologist Dr. Louis Meckel for AML. He will also follow up with his primary care physician. I'll see him on an as needed in the future.  All questions were answered. The patient knows to call the clinic with any problems, questions or concerns. I spent 10 minutes counseling the patient face to face. The total time spent in the appointment was 15 minutes and more than 50% was on counseling.     Truitt Merle, MD 07/19/2014 1:08 PM

## 2014-07-25 ENCOUNTER — Other Ambulatory Visit (HOSPITAL_COMMUNITY): Payer: Self-pay | Admitting: Urology

## 2014-07-25 DIAGNOSIS — D179 Benign lipomatous neoplasm, unspecified: Secondary | ICD-10-CM

## 2014-08-19 ENCOUNTER — Ambulatory Visit (HOSPITAL_COMMUNITY)
Admission: RE | Admit: 2014-08-19 | Discharge: 2014-08-19 | Disposition: A | Discharge status: Home / Self Care | Source: Ambulatory Visit | Attending: Urology | Admitting: Urology

## 2014-08-19 DIAGNOSIS — D179 Benign lipomatous neoplasm, unspecified: Secondary | ICD-10-CM | POA: Diagnosis present

## 2014-08-19 MED ORDER — GADOBENATE DIMEGLUMINE 529 MG/ML IV SOLN
20.0000 mL | Freq: Once | INTRAVENOUS | Status: AC | PRN
  Administered 2014-08-19: 20 mL via INTRAVENOUS

## 2014-08-30 ENCOUNTER — Other Ambulatory Visit: Payer: Self-pay | Admitting: *Deleted

## 2014-08-30 NOTE — Patient Outreach (Signed)
Attempt #1  made to contact [employer] member (referred for high cost claim), called home number twice- rang,it cut off- unable to leave voice message.   Plan to try again.     Zara Chess.   [place] [facility]  [phone]

## 2014-09-03 ENCOUNTER — Other Ambulatory Visit: Payer: Self-pay | Admitting: *Deleted

## 2014-09-03 NOTE — Patient Outreach (Signed)
Second attempt made to contact [employer] member (f/u on high cost claim, assess if any needs).  Unable to leave voice message as phone rang several times but then stopped.   Will try a third time.    Zara Chess.   [place] [facility]  [phone]

## 2014-09-16 ENCOUNTER — Other Ambulatory Visit: Payer: Self-pay | Admitting: *Deleted

## 2014-09-16 NOTE — Patient Outreach (Signed)
Third attempt made to contact [place] member, discuss [facility] services, assess if any needs.  Phone kept ringing, then cut off as did in previous attempts, unable to leave a voice message.   Plan to try again tomorrow, call cell phone listed.          Zara Chess.   [place] [facility]  [phone]

## 2014-09-19 ENCOUNTER — Other Ambulatory Visit: Payer: Self-pay | Admitting: *Deleted

## 2014-09-19 NOTE — Patient Outreach (Signed)
Three attempts made previously, unable to leave voice message on member's home phone.   Therefore, called [employer] member's cell number in Epic and was able to contact [employer] member.  Spoke with [employer] member, HIPPA verified.   Discussed with member [facility] services available to [facility] members (chronic disease programs, benefit exceptions)  to  which member states no issues at this time.   Informed member more information can be obtained on  by going to the [facility] website if needs arise in the future.    Plan to close case as no needs at this time.      Zara Chess.   [place] [facility]  [phone]

## 2015-02-17 ENCOUNTER — Other Ambulatory Visit: Payer: Self-pay | Admitting: Urology

## 2015-02-17 DIAGNOSIS — D179 Benign lipomatous neoplasm, unspecified: Secondary | ICD-10-CM

## 2015-03-07 ENCOUNTER — Ambulatory Visit (HOSPITAL_COMMUNITY)
Admission: RE | Admit: 2015-03-07 | Discharge: 2015-03-07 | Disposition: A | Discharge status: Home / Self Care | Payer: 59 | Source: Ambulatory Visit | Attending: Urology | Admitting: Urology

## 2015-03-07 DIAGNOSIS — D179 Benign lipomatous neoplasm, unspecified: Secondary | ICD-10-CM | POA: Insufficient documentation

## 2015-03-07 DIAGNOSIS — Q453 Other congenital malformations of pancreas and pancreatic duct: Secondary | ICD-10-CM | POA: Insufficient documentation

## 2015-03-07 DIAGNOSIS — D1771 Benign lipomatous neoplasm of kidney: Secondary | ICD-10-CM | POA: Insufficient documentation

## 2015-03-07 MED ORDER — GADOBENATE DIMEGLUMINE 529 MG/ML IV SOLN
20.0000 mL | Freq: Once | INTRAVENOUS | Status: AC | PRN
  Administered 2015-03-07: 19 mL via INTRAVENOUS

## 2015-08-07 ENCOUNTER — Other Ambulatory Visit: Payer: Self-pay | Admitting: Urology

## 2015-08-07 DIAGNOSIS — D179 Benign lipomatous neoplasm, unspecified: Secondary | ICD-10-CM

## 2015-08-13 MED FILL — DEXAMETHASONE 2 MG TABLET: 2 | 5 days supply | Qty: 15 | Fill #0

## 2015-08-13 MED FILL — levoFLOXacin 500 MG TABS: 500 | 7 days supply | Qty: 7 | Fill #0

## 2015-08-21 ENCOUNTER — Telehealth: Payer: Self-pay | Admitting: Hematology

## 2015-08-21 ENCOUNTER — Other Ambulatory Visit: Payer: Self-pay | Admitting: Hematology

## 2015-08-21 NOTE — Telephone Encounter (Signed)
per Dr Burr Medico to contact pt and adv to contact imaging center where he had CT and request a CD of images and vring w/him to appointment

## 2015-08-21 NOTE — Telephone Encounter (Signed)
per pof to sch pt appt-*cld & spoke to pt and gave pt time & date of appt on 6/1@1 :30

## 2015-08-29 ENCOUNTER — Ambulatory Visit: Admitting: Hematology

## 2015-09-04 ENCOUNTER — Encounter: Payer: Self-pay | Admitting: Hematology

## 2015-09-04 ENCOUNTER — Telehealth: Payer: Self-pay | Admitting: Hematology

## 2015-09-04 ENCOUNTER — Ambulatory Visit (HOSPITAL_BASED_OUTPATIENT_CLINIC_OR_DEPARTMENT_OTHER): Payer: 59 | Admitting: Hematology

## 2015-09-04 VITALS — BP 139/86 | HR 88 | Temp 98.7°F | Resp 18 | Ht 72.0 in | Wt 215.3 lb

## 2015-09-04 DIAGNOSIS — D696 Thrombocytopenia, unspecified: Secondary | ICD-10-CM | POA: Diagnosis not present

## 2015-09-04 DIAGNOSIS — J90 Pleural effusion, not elsewhere classified: Secondary | ICD-10-CM

## 2015-09-04 DIAGNOSIS — D649 Anemia, unspecified: Secondary | ICD-10-CM | POA: Diagnosis not present

## 2015-09-04 DIAGNOSIS — R911 Solitary pulmonary nodule: Secondary | ICD-10-CM | POA: Diagnosis not present

## 2015-09-04 DIAGNOSIS — D3001 Benign neoplasm of right kidney: Secondary | ICD-10-CM

## 2015-09-04 DIAGNOSIS — R918 Other nonspecific abnormal finding of lung field: Secondary | ICD-10-CM

## 2015-09-04 DIAGNOSIS — N2889 Other specified disorders of kidney and ureter: Secondary | ICD-10-CM | POA: Diagnosis not present

## 2015-09-04 NOTE — Progress Notes (Signed)
[place]  Telephone:(336) [phone] Fax:(336) [phone]  Clinic Follow Up Note   Patient Care Team: Haywood Pao, MD as PCP - General (Internal Medicine) Lyman Speller, RN as [facility] 09/04/2015  CHIEF COMPLAINTS:  Abnormal CT scan    HISTORY OF PRESENTING ILLNESS (04/09/2014):  [name] 50 y.o. male is here because of anemia, mild thrombocytopenia, and right kidney angiomyolipoma(AML) which was complicated by recent retroperitoneal hemorrhage.  His AML was discovered by image when he was in the [employer] in 2004. The image was obtained after some back injury. The tumor was about 2-3 cm, and he was recommended to follow-up only.   He presented to [facility] on 03/17/2014 was worsening right-sided flank pain and the skin bruise. CT of abdomen showed a large right retroperitoneal mass with associated acute retroperitoneal hemorrhage. The mass is consistent with AML with hemorrhage. CT angiogram showed a large aneurysm within the large AML mass. His CBC on admission showed WBC 18.1 K, hemoglobin 11.6, hematocrit 34.0, MCV 91, platelets 215. His hemoglobin dropped to 7.5 on 03/19/2014, and the platelet count dropped to 120 K range. He was seen by urology service and underwent right kidney mass embolization on 03/21/2014. He was also given 2 units of RBC transfusion. He was stabilized and discharged home on 02/20/2014. His CBC on discharge showed WBC 8.3, hemoglobin 9.2, platelet count 235.  He has recovered well from his recent hospitalization. His right flank pain and bruise a near resolved. He denies any other new symptoms. He has good energy level and appetite. No recent weight loss.  INTERIM HISTORY: [name] returns for follow up. He called me last week for a recent abnormal CT scan, and wanted to see me to discuss the scan findings. He is accompanied by his wife to the clinic today. He has been doing very well since I  saw him a year ago. He has been seeing his urologist, and had a follow-up MRI for his right kidney AML, which is getting smaller. No dysuria or hematuria. No frank pain.  He developed upper back pain after heavy lifting on 08/18/2015. He also had some cough and greenish sputum production 4-3 days before that, no chest pain, dyspnea, fever or other new symptoms. He went to urgent care for his upper back pain, chest x-ray was negative. However his d-dimer was positive. So he underwent CT chest PE protocol, which was negative for PE, but showed a soft tissue lesion in the right hilar about 1.2 cm.   He smoked for 2-3 years when he was young, 1 pack for a week, and quit long time ago.    MEDICAL HISTORY:  Past Medical History  Diagnosis Date  . GERD (gastroesophageal reflux disease)   . Seasonal allergies   . Pneumonia   . Pleural effusion, left   . Melanoma 2013, resected     SURGICAL HISTORY: Past Surgical History  Procedure Laterality Date  . Spinal fusion  1993  . Cervical discectomy  1998    SOCIAL HISTORY: History   Social History  . Marital Status: Married    Spouse Name: N/A    Number of Children: 1  . Years of Education: N/A   Occupational History  . IT computer    Social History Main Topics  . Smoking status: Former Smoker, quit 1990  . Smokeless tobacco: Not on file  . Alcohol Use: Yes     Comment: Occasional  . Drug Use:  No  . Sexual Activity: Not on file   Other Topics Concern  . Not on file   Social History Narrative    FAMILY HISTORY: No family history of malignancy or blood disorders.  ALLERGIES:  is allergic to other.  MEDICATIONS:  Current Outpatient Prescriptions  Medication Sig Dispense Refill  . Multiple Vitamins-Minerals (MULTIVITAMIN GUMMIES ADULT PO) Take 1 each by mouth daily.    . naproxen sodium (ANAPROX) 220 MG tablet Take 220 mg by mouth 2 (two) times daily as needed.    Marland Kitchen oxyCODONE (ROXICODONE) 5 MG immediate release tablet Take 1  tablet (5 mg total) by mouth every 4 (four) hours as needed. (Patient not taking: Reported on 04/19/2014) 15 tablet 0   No current facility-administered medications for this visit.    REVIEW OF SYSTEMS:   Constitutional: Denies fevers, chills or abnormal night sweats Eyes: Denies blurriness of vision, double vision or watery eyes Ears, nose, mouth, throat, and face: Denies mucositis or sore throat Respiratory: Denies cough, dyspnea or wheezes Cardiovascular: Denies palpitation, chest discomfort or lower extremity swelling Gastrointestinal:  Denies nausea, heartburn or change in bowel habits Skin: Denies abnormal skin rashes Lymphatics: Denies new lymphadenopathy or easy bruising Neurological:Denies numbness, tingling or new weaknesses Behavioral/Psych: Mood is stable, no new changes  All other systems were reviewed with the patient and are negative.  PHYSICAL EXAMINATION: ECOG PERFORMANCE STATUS: 0 - Asymptomatic  Filed Vitals:   09/04/15 1358  BP: 139/86  Pulse: 88  Temp: 98.7 F (37.1 C)  Resp: 18   Filed Weights   09/04/15 1358  Weight: 215 lb 4.8 oz (97.659 kg)    GENERAL:alert, no distress and comfortable SKIN: skin color, texture, turgor are normal, no rashes or significant lesions EYES: normal, conjunctiva are pink and non-injected, sclera clear OROPHARYNX:no exudate, no erythema and lips, buccal mucosa, and tongue normal  NECK: supple, thyroid normal size, non-tender, without nodularity LYMPH:  no palpable lymphadenopathy in the cervical, axillary or inguinal LUNGS: clear to auscultation and percussion with normal breathing effort HEART: regular rate & rhythm and no murmurs and no lower extremity edema ABDOMEN:abdomen soft, non-tender and normal bowel sounds Musculoskeletal:no cyanosis of digits and no clubbing  PSYCH: alert & oriented x 3 with fluent speech NEURO: no focal motor/sensory deficits  LABORATORY DATA:  I have reviewed the data as listed CBC Latest  Ref Rng 07/19/2014 04/19/2014 03/22/2014  WBC 4.0 - 10.3 10e3/uL 8.1 7.5 8.3  Hemoglobin 13.0 - 17.1 g/dL 16.1 13.1 9.2(L)  Hematocrit 38.4 - 49.9 % 46.9 39.5 26.6(L)  Platelets 140 - 400 10e3/uL 232 269 235    CMP Latest Ref Rng 07/19/2014 04/19/2014 03/22/2014  Glucose 70 - 140 mg/dl 98 94 130(H)  BUN 7.0 - 26.0 mg/dL 14.1 13.9 7  Creatinine 0.7 - 1.3 mg/dL 0.9 0.9 0.88  Sodium 136 - 145 mEq/L 141 140 136(L)  Potassium 3.5 - 5.1 mEq/L 4.9 4.9 3.8  Chloride 96 - 112 mEq/L - - 95(L)  CO2 22 - 29 mEq/L 23 27 29   Calcium 8.4 - 10.4 mg/dL 7.4(L) 9.4 8.3(L)  Total Protein 6.4 - 8.3 g/dL 7.4 7.7 -  Total Bilirubin 0.20 - 1.20 mg/dL 0.43 0.57 -  Alkaline Phos 40 - 150 U/L 70 81 -  AST 5 - 34 U/L 24 19 -  ALT 0 - 55 U/L 26 19 -      RADIOGRAPHIC STUDIES: I have personally reviewed the radiological images as listed and agreed with the findings in the report.  Ct Abdomen Pelvis W Wo Contrast 03/20/2014    ADDENDUM REPORT: 03/20/2014 17:20  ADDENDUM: Again, as was stated on prior noncontrast abdominal CT performed 03/17/2014, the assumption is that this largely fat containing right-sided retroperitoneal mass represents an AML though as mass has significantly enlarged since 2006 examination (previously 4.2 cm, currently, approximately 13.1 cm) more aggressive etiologies (such as a liposarcoma) should be considered as additional diagnostic considerations.     IMPRESSION: Slight progression of intra and extra lesional hemorrhage about the previously noted approximately 13.1 cm right-sided renal AML. Note is made of an approximately 2.6 x 1.5 cm pseudoaneurysm located within the central aspect of the AML. No definitive areas of active extravasation.    Mr Abdomen W Wo Contrast 04/09/2014 IMPRESSION: Giant right renal angiomyolipoma shows decrease in associated retroperitoneal hemorrhage. Previously seen internal pseudoaneurysm is now thrombosed, although residual soft tissue enhancement is noted in the  superior portion of this lesion.   Electronically Signed   By: Earle Gell M.D.   On: 04/09/2014 13:53   CT chest 04/29/2014 IMPRESSION: 1. No evidence of cystic lung disease. 2. Interval coiling of a hemorrhagic right renal angiomyolipoma, partially imaged.   ASSESSMENT & PLAN:  50 year old Caucasian male  1. Right hilar mass, possible a slightly enlarged lymph node  -I reviewed his outside CT chest, it showed a small 1.2 cm soft tissue lesion in the right hilar, likely a lymph node. He did have cough and sputum production when he had a CT scan, possible reactive. He smoked only for couple years as a light smoker, no high risk for lung cancer. I do not think this is related to his kidney AML. -I reviewed his prior CT from 04/2014, the right hilar soft tissue mass is new from 2016. -He is clinically doing very well, asymptomatic, no high suspicious for malignancy. -I recommend him to have a follow-up CT scan in 3 months, he agrees. I'll set up for him.  2. Right kidney angiomyolipoma with acute hemorrhage -He is status post embolization due to the acute hemorrhage. -His MRI after embolization showed decreased right AML, with residual soft tissue enhancement in the superior portion of this lesion. -He will follow up with urologist Dr. Louis Meckel, next appointment in a few weeks.   3. Pulmonary nodule and trace pleural effusion -he had pulmonary nodule since 2004, recent CT abd showed stable LLL nodule (39mm), likely benign. -some renal AML are associated with pulmonary lymphangioleiomyomatosis, CT chest in January 2016 was normal..    Follow-up -I'll see him back in 3 months with a repeated the CT chest without contrast  All questions were answered. The patient knows to call the clinic with any problems, questions or concerns.  I spent 20 minutes counseling the patient face to face. The total time spent in the appointment was 25 minutes and more than 50% was on counseling.     Truitt Merle, MD 09/04/2015 2:53 PM

## 2015-09-04 NOTE — Telephone Encounter (Signed)
Gave pt apt & avs °

## 2015-09-05 ENCOUNTER — Other Ambulatory Visit: Payer: Self-pay | Admitting: Hematology

## 2015-09-05 ENCOUNTER — Ambulatory Visit (HOSPITAL_COMMUNITY)
Admission: RE | Admit: 2015-09-05 | Discharge: 2015-09-05 | Disposition: A | Discharge status: Home / Self Care | Payer: 59 | Source: Ambulatory Visit | Attending: Urology | Admitting: Urology

## 2015-09-05 ENCOUNTER — Inpatient Hospital Stay
Admission: RE | Admit: 2015-09-05 | Discharge: 2015-09-05 | Disposition: A | Discharge status: Home / Self Care | Payer: Self-pay | Source: Ambulatory Visit | Attending: Hematology | Admitting: Hematology

## 2015-09-05 DIAGNOSIS — Z9889 Other specified postprocedural states: Secondary | ICD-10-CM | POA: Diagnosis not present

## 2015-09-05 DIAGNOSIS — C801 Malignant (primary) neoplasm, unspecified: Secondary | ICD-10-CM

## 2015-09-05 DIAGNOSIS — D1771 Benign lipomatous neoplasm of kidney: Secondary | ICD-10-CM | POA: Diagnosis not present

## 2015-09-05 DIAGNOSIS — D179 Benign lipomatous neoplasm, unspecified: Secondary | ICD-10-CM

## 2015-09-05 MED ORDER — GADOBENATE DIMEGLUMINE 529 MG/ML IV SOLN
20.0000 mL | Freq: Once | INTRAVENOUS | Status: AC | PRN
  Administered 2015-09-05: 20 mL via INTRAVENOUS

## 2015-11-26 ENCOUNTER — Encounter (HOSPITAL_COMMUNITY): Payer: Self-pay

## 2015-11-26 ENCOUNTER — Ambulatory Visit (HOSPITAL_COMMUNITY)
Admission: RE | Admit: 2015-11-26 | Discharge: 2015-11-26 | Disposition: A | Discharge status: Home / Self Care | Payer: 59 | Source: Ambulatory Visit | Attending: Hematology | Admitting: Hematology

## 2015-11-26 DIAGNOSIS — R911 Solitary pulmonary nodule: Secondary | ICD-10-CM | POA: Diagnosis not present

## 2015-11-26 DIAGNOSIS — D3001 Benign neoplasm of right kidney: Secondary | ICD-10-CM | POA: Diagnosis not present

## 2015-11-26 DIAGNOSIS — F17200 Nicotine dependence, unspecified, uncomplicated: Secondary | ICD-10-CM | POA: Diagnosis not present

## 2015-11-27 ENCOUNTER — Encounter: Payer: Self-pay | Admitting: Hematology

## 2015-11-27 ENCOUNTER — Ambulatory Visit (HOSPITAL_BASED_OUTPATIENT_CLINIC_OR_DEPARTMENT_OTHER): Payer: 59 | Admitting: Hematology

## 2015-11-27 VITALS — BP 157/99 | HR 75 | Temp 98.4°F | Resp 18 | Ht 72.0 in | Wt 216.7 lb

## 2015-11-27 DIAGNOSIS — R918 Other nonspecific abnormal finding of lung field: Secondary | ICD-10-CM

## 2015-11-27 DIAGNOSIS — D3001 Benign neoplasm of right kidney: Secondary | ICD-10-CM | POA: Diagnosis not present

## 2015-11-27 DIAGNOSIS — R911 Solitary pulmonary nodule: Secondary | ICD-10-CM | POA: Diagnosis not present

## 2015-11-27 DIAGNOSIS — IMO0001 Reserved for inherently not codable concepts without codable children: Secondary | ICD-10-CM | POA: Insufficient documentation

## 2015-11-27 NOTE — Progress Notes (Signed)
[place]  [phone]) [phone] [phone]  Clinic Follow Up Note   Patient Care Team: Haywood Pao, MD as PCP - General (Internal Medicine) 11/27/2015  CHIEF COMPLAINTS:  Follow up CT scan findings    HISTORY OF PRESENTING ILLNESS (04/09/2014):  [name] 50 y.o. male is here because of anemia, mild thrombocytopenia, and right kidney angiomyolipoma(AML) which was complicated by recent retroperitoneal hemorrhage.  His AML was discovered by image when he was in the [employer] in 2004. The image was obtained after some back injury. The tumor was about 2-3 cm, and he was recommended to follow-up only.   He presented to [facility] on 03/17/2014 was worsening right-sided flank pain and the skin bruise. CT of abdomen showed a large right retroperitoneal mass with associated acute retroperitoneal hemorrhage. The mass is consistent with AML with hemorrhage. CT angiogram showed a large aneurysm within the large AML mass. His CBC on admission showed WBC 18.1 K, hemoglobin 11.6, hematocrit 34.0, MCV 91, platelets 215. His hemoglobin dropped to 7.5 on 03/19/2014, and the platelet count dropped to 120 K range. He was seen by urology service and underwent right kidney mass embolization on 03/21/2014. He was also given 2 units of RBC transfusion. He was stabilized and discharged home on 02/20/2014. His CBC on discharge showed WBC 8.3, hemoglobin 9.2, platelet count 235.  He has recovered well from his recent hospitalization. His right flank pain and bruise a near resolved. He denies any other new symptoms. He has good energy level and appetite. No recent weight loss.  INTERIM HISTORY: [name] returns for follow up. I saw him 3 months ago for a abnormal CT scan finding. He is here for follow-up with a repeated CT chest. He has been doing well since his last visit, denies any chest discomfort, shortness breath, or other symptoms. He has good appetite and energy  level, feels well overall.    MEDICAL HISTORY:  Past Medical History  Diagnosis Date  . GERD (gastroesophageal reflux disease)   . Seasonal allergies   . Pneumonia   . Pleural effusion, left   . Melanoma 2013, resected     SURGICAL HISTORY: Past Surgical History  Procedure Laterality Date  . Spinal fusion  1993  . Cervical discectomy  1998    SOCIAL HISTORY: History   Social History  . Marital Status: Married    Spouse Name: N/A    Number of Children: 1  . Years of Education: N/A   Occupational History  . IT computer    Social History Main Topics  . Smoking status: Former Smoker, quit 1990  . Smokeless tobacco: Not on file  . Alcohol Use: Yes     Comment: Occasional  . Drug Use: No  . Sexual Activity: Not on file   Other Topics Concern  . Not on file   Social History Narrative    FAMILY HISTORY: No family history of malignancy or blood disorders.  ALLERGIES:  is allergic to other.  MEDICATIONS:  Current Outpatient Prescriptions  Medication Sig Dispense Refill  . Multiple Vitamins-Minerals (MULTIVITAMIN GUMMIES ADULT PO) Take 1 each by mouth daily.    . naproxen sodium (ANAPROX) 220 MG tablet Take 220 mg by mouth 2 (two) times daily as needed.    Marland Kitchen omeprazole (PRILOSEC) 20 MG capsule Take 20 mg by mouth daily.    Marland Kitchen oxyCODONE (ROXICODONE) 5 MG immediate release tablet Take 1 tablet (5 mg total) by mouth every 4 (four) hours  as needed. (Patient not taking: Reported on 04/19/2014) 15 tablet 0   No current facility-administered medications for this visit.     REVIEW OF SYSTEMS:   Constitutional: Denies fevers, chills or abnormal night sweats Eyes: Denies blurriness of vision, double vision or watery eyes Ears, nose, mouth, throat, and face: Denies mucositis or sore throat Respiratory: Denies cough, dyspnea or wheezes Cardiovascular: Denies palpitation, chest discomfort or lower extremity swelling Gastrointestinal:  Denies nausea, heartburn or change in  bowel habits Skin: Denies abnormal skin rashes Lymphatics: Denies new lymphadenopathy or easy bruising Neurological:Denies numbness, tingling or new weaknesses Behavioral/Psych: Mood is stable, no new changes  All other systems were reviewed with the patient and are negative.  PHYSICAL EXAMINATION: ECOG PERFORMANCE STATUS: 0 - Asymptomatic  Vitals:   11/27/15 1601  BP: (!) 157/99  Pulse: 75  Resp: 18  Temp: 98.4 F (36.9 C)   Filed Weights   11/27/15 1601  Weight: 216 lb 11.2 oz (98.3 kg)    GENERAL:alert, no distress and comfortable SKIN: skin color, texture, turgor are normal, no rashes or significant lesions EYES: normal, conjunctiva are pink and non-injected, sclera clear OROPHARYNX:no exudate, no erythema and lips, buccal mucosa, and tongue normal  NECK: supple, thyroid normal size, non-tender, without nodularity LYMPH:  no palpable lymphadenopathy in the cervical, axillary or inguinal LUNGS: clear to auscultation and percussion with normal breathing effort HEART: regular rate & rhythm and no murmurs and no lower extremity edema ABDOMEN:abdomen soft, non-tender and normal bowel sounds Musculoskeletal:no cyanosis of digits and no clubbing  PSYCH: alert & oriented x 3 with fluent speech NEURO: no focal motor/sensory deficits  LABORATORY DATA:  I have reviewed the data as listed CBC Latest Ref Rng & Units 07/19/2014 04/19/2014 03/22/2014  WBC 4.0 - 10.3 10e3/uL 8.1 7.5 8.3  Hemoglobin 13.0 - 17.1 g/dL 16.1 13.1 9.2(L)  Hematocrit 38.4 - 49.9 % 46.9 39.5 26.6(L)  Platelets 140 - 400 10e3/uL 232 269 235    CMP Latest Ref Rng & Units 07/19/2014 04/19/2014 03/22/2014  Glucose 70 - 140 mg/dl 98 94 130(H)  BUN 7.0 - 26.0 mg/dL 14.1 13.9 7  Creatinine 0.7 - 1.3 mg/dL 0.9 0.9 0.88  Sodium 136 - 145 mEq/L 141 140 136(L)  Potassium 3.5 - 5.1 mEq/L 4.9 4.9 3.8  Chloride 96 - 112 mEq/L - - 95(L)  CO2 22 - 29 mEq/L 23 27 29   Calcium 8.4 - 10.4 mg/dL 7.4(L) 9.4 8.3(L)  Total  Protein 6.4 - 8.3 g/dL 7.4 7.7 -  Total Bilirubin 0.20 - 1.20 mg/dL 0.43 0.57 -  Alkaline Phos 40 - 150 U/L 70 81 -  AST 5 - 34 U/L 24 19 -  ALT 0 - 55 U/L 26 19 -      RADIOGRAPHIC STUDIES: I have personally reviewed the radiological images as listed and agreed with the findings in the report.  Ct Abdomen Pelvis W Wo Contrast 03/20/2014    ADDENDUM REPORT: 03/20/2014 17:20  ADDENDUM: Again, as was stated on prior noncontrast abdominal CT performed 03/17/2014, the assumption is that this largely fat containing right-sided retroperitoneal mass represents an AML though as mass has significantly enlarged since 2006 examination (previously 4.2 cm, currently, approximately 13.1 cm) more aggressive etiologies (such as a liposarcoma) should be considered as additional diagnostic considerations.     IMPRESSION: Slight progression of intra and extra lesional hemorrhage about the previously noted approximately 13.1 cm right-sided renal AML. Note is made of an approximately 2.6 x 1.5 cm pseudoaneurysm located  within the central aspect of the AML. No definitive areas of active extravasation.    Mr Abdomen W Wo Contrast 04/09/2014 IMPRESSION: Giant right renal angiomyolipoma shows decrease in associated retroperitoneal hemorrhage. Previously seen internal pseudoaneurysm is now thrombosed, although residual soft tissue enhancement is noted in the superior portion of this lesion.   Electronically Signed   By: Earle Gell M.D.   On: 04/09/2014 13:53   CT chest wo contrast 11/26/2015 IMPRESSION: 1. Resolution of RIGHT perihilar nodule consistent with benign inflammatory infectious process. 2. Small 5 mm subpleural nodule the RIGHT lower lobe. Recommend single additional follow-up in 12 months in patient with smoking history.    ASSESSMENT & PLAN:  50 year old Caucasian male  1. Right hilar mass, possible a slightly enlarged lymph node and small pulmonary nodule -I reviewed the findings from his  repeated his CT chest from yesterday.  The previous right hilar mass/lymph node has resolved. He has a small subcutaneous or peripheral nodule in the right lower lobe of lung, which has been stable since the CT scan from January 2016. This is likely a benign lesion. He had a minimal smoking history, has low risk for lung cancer.  -I do not feel he needs more routine follow-up CT scans.   2. Right kidney angiomyolipoma with acute hemorrhage -He is status post embolization due to the acute hemorrhage. -His MRI after embolization showed decreased right AML, with residual soft tissue enhancement in the superior portion of this lesion. -He will follow up with urologist Dr. Louis Meckel, next appointment in May 2018     Follow-up -I'll see him only as needed in the future.   All questions were answered. The patient knows to call the clinic with any problems, questions or concerns.  I spent 15 minutes counseling the patient face to face. The total time spent in the appointment was 20 minutes and more than 50% was on counseling.     Truitt Merle, MD 11/27/2015 4:46 PM

## 2016-02-28 ENCOUNTER — Telehealth: Payer: 59 | Admitting: Family

## 2016-02-28 DIAGNOSIS — T63481A Toxic effect of venom of other arthropod, accidental (unintentional), initial encounter: Secondary | ICD-10-CM

## 2016-02-28 MED ORDER — PREDNISONE 10 MG (21) PO TBPK: 10.0000 mg | ORAL_TABLET | ORAL | 0 refills | Status: DC

## 2016-02-28 NOTE — Progress Notes (Signed)
E Visit for Insect Sting  Thank you for describing the insect sting for us.  Here is how we plan to help!  A sting that we will treat with a short course of prednisone.  The 2 greatest risks from insect stings are allergic reaction, which can be fatal in some people and infection, which is more common and less serious.  Bees, wasps, yellow jackets, and hornets belong to a class of insects called Hymenoptera.  Most insect stings cause only minor discomfort.  Stings can happen anywhere on the body and can be painful.  Most stings are from honey bees or yellow jackets.  Fire ants can sting multiple times.  The sites of the stings are more likely to become infected.    I have sent in prednisone 10 mg tapering dose for 5 days to the pharmacy you selected.  Please make sure that you selected a pharmacy that is open now.  What can be used to prevent Insect Stings?   Insect repellant with at least 20% DEET.    Wearing long pants and shirts with socks and shoes.    Wear dark or drab-colored clothes rather than bright colors.    Avoid using perfumes and hair sprays; these attract insects.  HOME CARE ADVICE:  1. Stinger removal:  The stinger looks like a tiny black dot in the sting.  Use a fingernail, credit card edge, or knife-edge to scrape it off.  Don't pull it out because it squeezes out more venom.  If the stinger is below the skin surface, leave it alone.  It will be shed with normal skin healing. 2. Use cold compresses to the area of the sting for 10-20 minutes.  You may repeat this as needed to relieve symptoms of pain and swelling. 3.  For pain relief, take acetominophen 650 mg 4-6 hours as needed or ibuprofen 400 mg every 6-8 hours as needed or naproxen 250-500 mg every 12 hours as needed. 4.  You can also use hydrocortisone cream 0.5% or 1% up to 4 times daily as needed for itching. 5.  If the sting becomes very itchy, take Benadryl 25-50 mg, follow directions on box. 6.   Wash the area 2-3 times daily with antibacterial soap and warm water. 7. Call your Doctor if:  Fever, a severe headache, or rash occur in the next 2 weeks.  Sting area begins to look infected.  Redness and swelling worsens after home treatment.  Your current symptoms become worse.    MAKE SURE YOU:   Understand these instructions.  Will watch your condition.  Will get help right away if you are not doing well or get worse.  Thank you for choosing an e-visit. Your e-visit answers were reviewed by a board certified advanced clinical practitioner to complete your personal care plan. Depending upon the condition, your plan could have included both over the counter or prescription medications. Please review your pharmacy choice. Be sure that the pharmacy you have chosen is open so that you can pick up your prescription now.  If there is a problem you may message your provider in MyChart to have the prescription routed to another pharmacy. Your safety is important to us. If you have drug allergies check your prescription carefully.  For the next 24 hours, you can use MyChart to ask questions about today's visit, request a non-urgent call back, or ask for a work or school excuse from your e-visit provider. You will get an email in the next   two days asking about your experience. I hope that your e-visit has been valuable and will speed your recovery.    

## 2016-04-14 IMAGING — CT CT ABD-PEL WO/W CM
2 of 11 series · 10 of 46 positions shown, 15 images · IV contrast (omnipaque)
Comparison: CT abdomen pelvis- 03/17/2014; chest CT - 05/15/2004

ADDENDUM:
Critical Value/emergent results were called by telephone at the time
of interpretation on 03/20/2014 at [DATE] to Dr. IOSIF ARVIZO ,
who verbally acknowledged these results.

Again, as was stated on prior noncontrast abdominal CT performed
03/17/2014, the assumption is that this largely fat containing
right-sided retroperitoneal mass represents an AML though as mass
has significantly enlarged since 0330 examination (previously
cm, currently, approximately 13.1 cm) more aggressive etiologies
(such as a liposarcoma) should be considered as additional
diagnostic considerations.
CLINICAL DATA: Evaluate renal AML.
EXAM:
CT ABDOMEN AND PELVIS WITHOUT AND WITH CONTRAST
TECHNIQUE: Multidetector CT imaging of the abdomen and pelvis was performed
following the standard protocol before and following the bolus
administration of intravenous contrast.
CONTRAST:  100mL OMNIPAQUE IOHEXOL 300 MG/ML  SOLN

[Series 6: venous · axial · portal-venous · 0.80mm/px · z∈[-506,-110]mm · 8 of 166 slices shown, 13 images]
[im 17/166  soft-tissue]
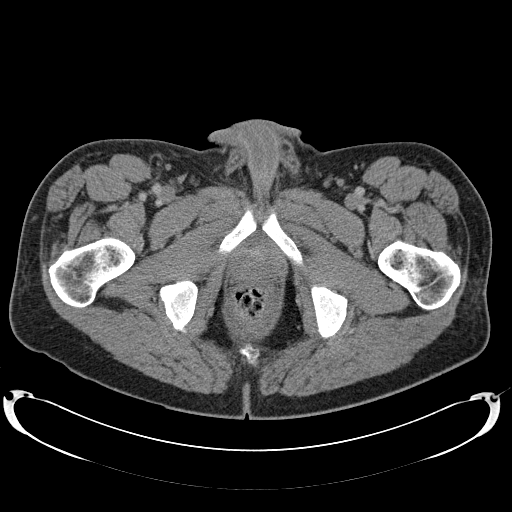
[im 17/166  bone]
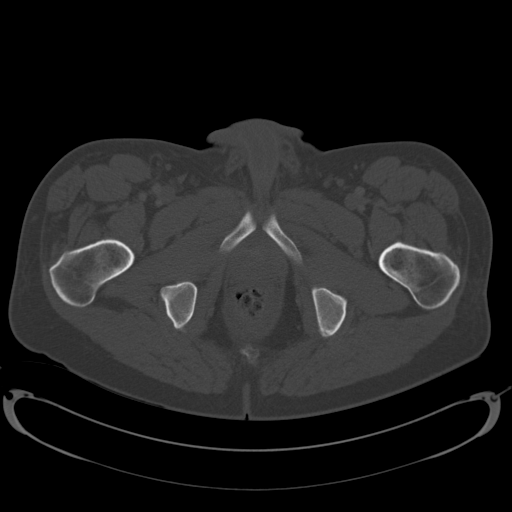
[im 34/166  soft-tissue]
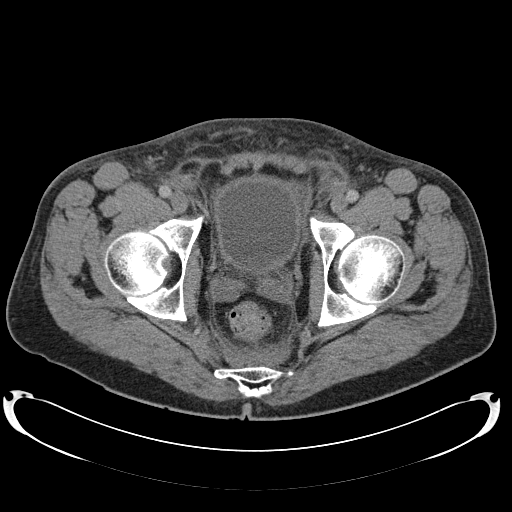
[im 50/166  soft-tissue]
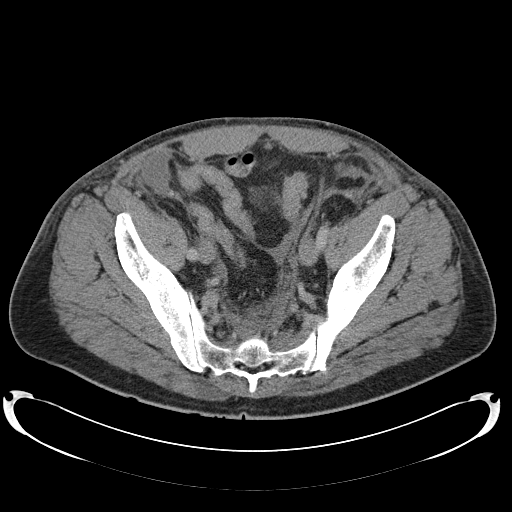
[im 67/166  soft-tissue]
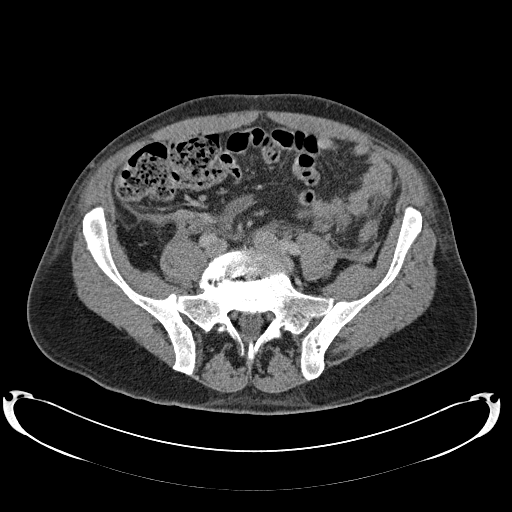
[im 100/166  soft-tissue]
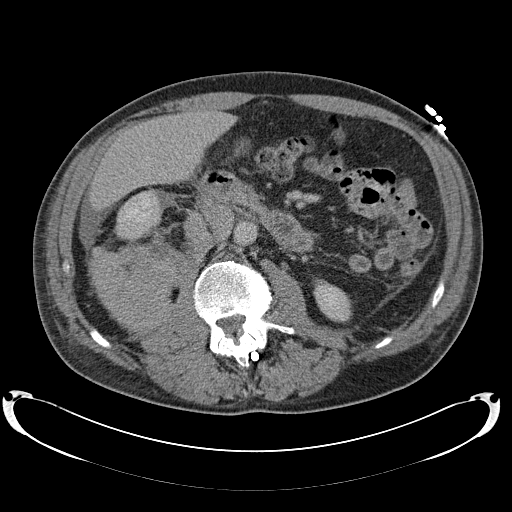
[im 100/166  lung]
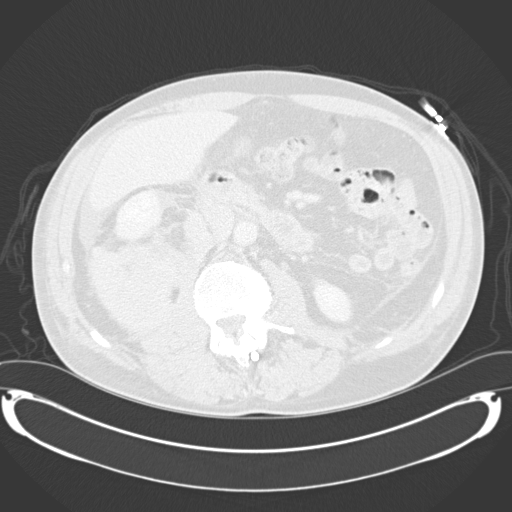
[im 116/166  soft-tissue]
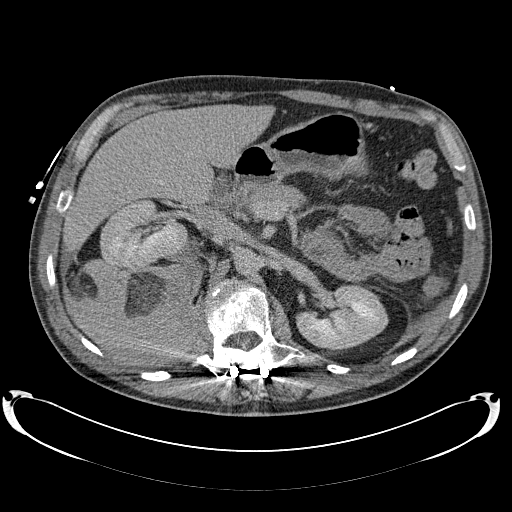
[im 116/166  lung]
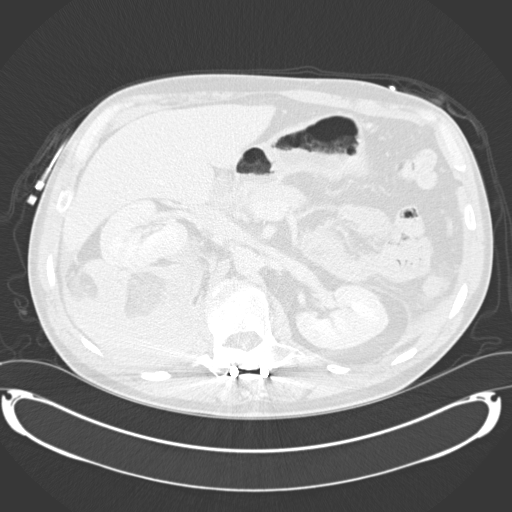
[im 133/166  soft-tissue]
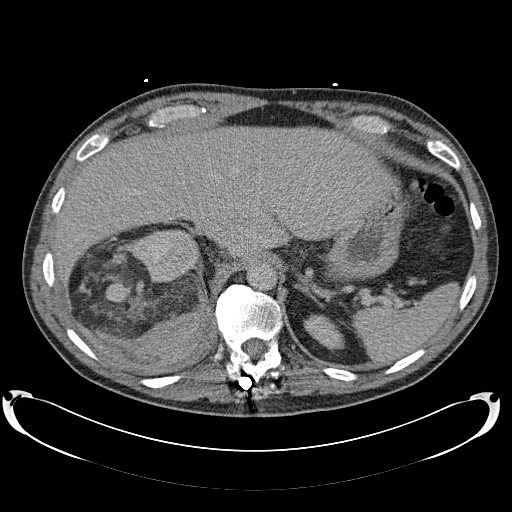
[im 133/166  lung]
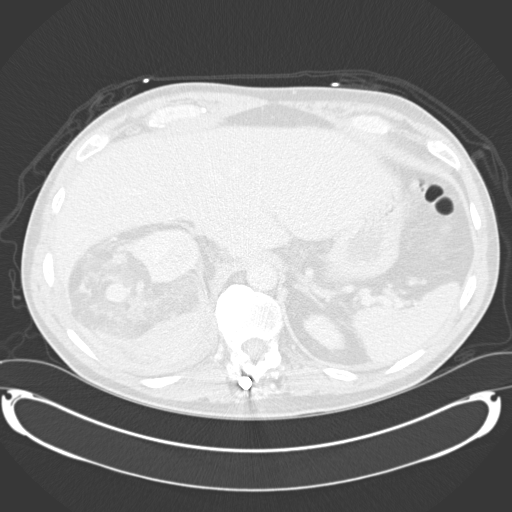
[im 149/166  soft-tissue]
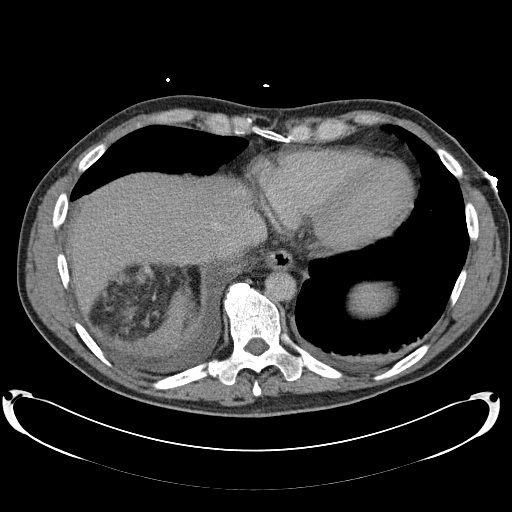
[im 149/166  lung]
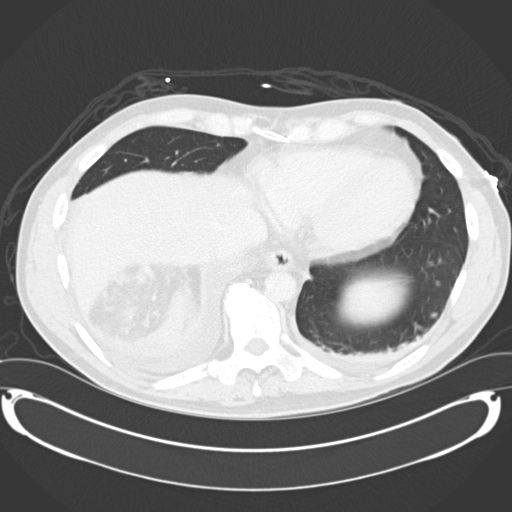

[Series 602: <mpr thick range> · coronal · 0.97mm/px · 2 of 90 slices shown]
[im 30/90  soft-tissue]
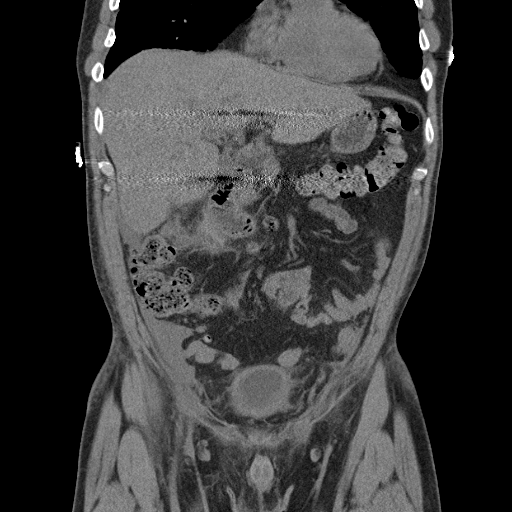
[im 60/90  soft-tissue]
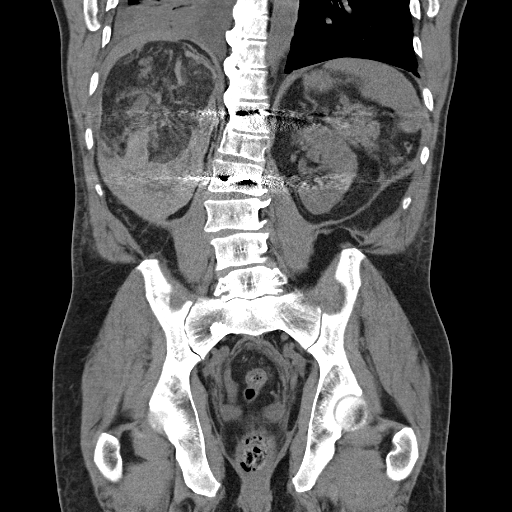

[10 of 46 positions shown; findings below may reference images not displayed]

FINDINGS: Vascular Findings:

Abdominal aorta: Scattered minimal amount of mixed calcified and
noncalcified atherosclerotic plaque within the distal aspect of the
the normal caliber abdominal aorta, not resulting in hemodynamically
significant stenosis. No abdominal aortic dissection or periaortic
stranding.

Celiac artery: Widely patent without hemodynamically significant
narrowing. Conventional branching pattern.

SMA: Widely patent without hemodynamically significant narrowing.
Conventional branching pattern.

Right Renal artery: Solitary; widely patent without hemodynamically
significant narrowing. A branch of the superior division of the
right renal artery appears to be the solitary vascular supply to a
large approximately 2.6 x 1.5 cm pseudoaneurysm within the central
aspect of the large right-sided renal AML (representative axial
604).

Left Renal artery: Solitary ; widely patent without hemodynamically
significant narrowing.

IMA: Widely patent without hemodynamically significant narrowing.

Pelvic vasculature: The proximal aspect of the bilateral common
iliac arteries are widely patent without hemodynamically significant
narrowing.

Review of the MIP images confirms the above findings.

--------------------------------------------------------------------------------

Nonvascular Findings:

The previously identified complex fat containing right-sided renal
angiomyolipoma appears similar to the prior examination though there
has been minimal progression of both intra and extra lesional
hemorrhage, especially about the caudal aspect of the right-sided
the retroperitoneum. A minimal amount of evolving blood products are
seen within the pelvic cul-de-sac.

While definitive measurements are difficult secondary to the
concomitant intra and extra-lesional hemorrhage, the right renal
angiomyolipoma measures at least 11.4 x 9.9 x 13.1 cm (as measured
in greatest oblique axial - image 31, series 2 and coronal - image
59, series 602) dimensions respectively. Note, this right-sided
renal angiomyolipoma appears increased in size since remote chest CT
performed 05/15/2004 at which time it measured at least 4.2 x 4.1 cm

Note is made of an approximately 2.6 x 1.5 cm pseudoaneurysm within
the central aspect of the angiomyolipoma (coronal image 59, series
604). There are no definitive areas of active extravasation.

There is homogeneous enhancement and excretion of the bilateral
kidneys. No urinary obstruction. No discrete left-sided renal
lesions. No renal stones.

There is mass effect of the right-sided retroperitoneal hemorrhage
on the posterior aspect of the right lobe of the liver. Otherwise,
normal hepatic contour. No discrete hepatic lesions. Normal
appearance of the gallbladder. No radiopaque gallstones. There is a
minimal amount of ascites tracking along the caudal aspect of the
right lobe of the liver (image 71, series 4).

Normal appearance of the bilateral adrenal glands, pancreas and
spleen.

Moderate colonic stool burden without evidence of enteric
obstruction. Normal appearance of the retrocecal appendix. No
pneumoperitoneum, pneumatosis or portal venous gas.

No definitive retroperitoneal, mesenteric, pelvic or inguinal
lymphadenopathy.

There is mild diffuse thickening of the urinary bladder wall,
possibly accentuated due to underdistention. Prostate is borderline
enlarged measuring 5.4 x 4.4 cm.

No acute or aggressive osseous abnormalities. Post T12-L2 paraspinal
fusion. Stigmata DISH within the caudal aspect of the thoracic
spine. Moderate DDD of L5-S1 with disc space height loss, endplate
irregularity and sclerosis. Note is made of a prominent bone island
within the right sacral ala. There is mild diffuse body wall
anasarca. Small bilateral mesenteric fat containing inguinal
hernias, left greater than right.
IMPRESSION: Slight progression of intra and extra lesional hemorrhage about the
previously noted approximately 13.1 cm right-sided renal AML. Note
is made of an approximately 2.6 x 1.5 cm pseudoaneurysm located
within the central aspect of the AML. No definitive areas of active
extravasation.

Critical Value/emergent results were called by telephone at the time
of interpretation on 03/20/2014 at [DATE] to Dr. IOSIF ARVIZO ,
who verbally acknowledged these results.

## 2016-09-10 ENCOUNTER — Other Ambulatory Visit: Payer: Self-pay | Admitting: Urology

## 2016-09-10 DIAGNOSIS — D1771 Benign lipomatous neoplasm of kidney: Secondary | ICD-10-CM

## 2016-10-01 ENCOUNTER — Ambulatory Visit (HOSPITAL_COMMUNITY)
Admission: RE | Admit: 2016-10-01 | Discharge: 2016-10-01 | Disposition: A | Discharge status: Home / Self Care | Payer: 59 | Source: Ambulatory Visit | Attending: Urology | Admitting: Urology

## 2016-10-01 DIAGNOSIS — D1771 Benign lipomatous neoplasm of kidney: Secondary | ICD-10-CM | POA: Diagnosis not present

## 2016-10-01 MED ORDER — GADOBENATE DIMEGLUMINE 529 MG/ML IV SOLN
20.0000 mL | Freq: Once | INTRAVENOUS | Status: AC | PRN
  Administered 2016-10-01: 20 mL via INTRAVENOUS

## 2016-10-20 ENCOUNTER — Other Ambulatory Visit: Payer: Self-pay | Admitting: Urology

## 2016-10-20 DIAGNOSIS — D1771 Benign lipomatous neoplasm of kidney: Secondary | ICD-10-CM

## 2016-11-02 ENCOUNTER — Ambulatory Visit
Admission: RE | Admit: 2016-11-02 | Discharge: 2016-11-02 | Disposition: A | Discharge status: Home / Self Care | Payer: 59 | Source: Ambulatory Visit | Attending: Urology | Admitting: Urology

## 2016-11-02 DIAGNOSIS — D1771 Benign lipomatous neoplasm of kidney: Secondary | ICD-10-CM

## 2016-11-02 HISTORY — DX: Benign lipomatous neoplasm of kidney: D17.71

## 2016-11-02 HISTORY — PX: IR RADIOLOGIST EVAL & MGMT: IMG5224

## 2016-11-02 NOTE — Progress Notes (Signed)
Chief Complaint: I have a right kidney tumor.   Referring Physician(s): Herrick,Benjamin W  History of Present Illness: [name] is a 51 y.o. male presenting today as a scheduled follow-up to Vascular & Interventional Radiology clinic, SP prior embolization of a right renal AML.   [name] was treated as an in-patient at [facility] in December 2015, after he originally presented with a large peri-nephric hematoma.  He was working in his yard at the time, when he experienced acute lower right back pain which worsened over time, prompting his visit to the ED.  He was admitted with the hematoma, and he underwent angiogram and embolization of the right AML on 03/21/2014.  This was performed with coil embo of a large intra-tumoral arterial aneurysm/pseudoaneurysm, as well as particle embolization.    After his discharge, he experienced about 1 to 2 months of fatigue and malaise, but tells me that resolved in February of 2016.  Since then, he has had no back pain/flank pain, no hematuria, no recurrent admissions.  He has resumed his activity, although tells me that he does not exercise with the same intensity as previously.    He has been following up with Dr. Carlton Adam office every 6 months, and has had interval surveillance MRI studies, with the last 10/01/2016.  There are no concerns for growth on the MRI.    He tells me that he relocated to [facility] from [place] in 2004, and he had a prior CT in ~2002 that showed a 2-4cm right renal tumor at the time.  He was asymptomatic until his recent hemorrhage.   Otherwise, he feels fine, with no other complaints.     Past Medical History:  Diagnosis Date  . GERD (gastroesophageal reflux disease)   . Melanoma ([place])   . Pleural effusion, left   . Pneumonia   . Renal angiomyolipoma    Right renal angiomyolipoma  . Seasonal allergies     Past Surgical History:  Procedure Laterality Date  . CERVICAL DISCECTOMY    . SPINAL FUSION       Allergies: Other  Medications: Prior to Admission medications   Medication Sig Start Date End Date Taking? Authorizing Provider  Multiple Vitamins-Minerals (MULTIVITAMIN GUMMIES ADULT PO) Take 1 each by mouth daily.   Yes [provider]  naproxen sodium (ANAPROX) 220 MG tablet Take 220 mg by mouth 2 (two) times daily as needed.   Yes [provider]  omeprazole (PRILOSEC) 20 MG capsule Take 20 mg by mouth daily.   Yes [provider]  oxyCODONE (ROXICODONE) 5 MG immediate release tablet Take 1 tablet (5 mg total) by mouth every 4 (four) hours as needed. Patient not taking: Reported on 04/19/2014 03/20/14   Ardis Hughs, MD  predniSONE (STERAPRED UNI-PAK 21 TAB) 10 MG (21) TBPK tablet Take 1 tablet (10 mg total) by mouth as directed. 21 dose pack taper (sterapred generic to be dosed by pharmacist) Patient not taking: Reported on 11/02/2016 02/28/16   Benjamine Mola, FNP     Family History  Problem Relation Age of Onset  . Cancer Father 60       lung cancer     Social History   Social History  . Marital status: Married    Spouse name: N/A  . Number of children: N/A  . Years of education: N/A   Social History Main Topics  . Smoking status: Former Smoker    Packs/day: 0.25    Years: 2.00  .  Smokeless tobacco: Never Used  . Alcohol use Yes     Comment: Occasional  . Drug use: No  . Sexual activity: Not on file   Other Topics Concern  . Not on file   Social History Narrative  . No narrative on file    Review of Systems: A 12 point ROS discussed and pertinent positives are indicated in the HPI above.  All other systems are negative.  Review of Systems  Vital Signs: BP (!) 142/85   Pulse 78   Temp 98.3 F (36.8 C) (Oral)   Resp 14   Ht 6\' 1"  (1.854 m)   Wt 225 lb (102.1 kg)   SpO2 100%   BMI 29.69 kg/m   Physical Exam  General: 51 yo male appearing  stated age.  Well-developed, well-nourished.  No distress. HEENT:  Atraumatic, normocephalic.  Conjugate gaze, extra-ocular motor intact. No scleral icterus or scleral injection. No lesions on external ears, nose, lips, or gums.  Oral mucosa moist, pink.  Neck: Symmetric with no goiter enlargement.  Chest/Lungs:  Symmetric chest with inspiration/expiration.  No labored breathing.     Heart:    No JVD appreciated.  Abdomen:  Soft, NT/ND, with + bowel sounds.   Genito-urinary: Deferred Neurologic: Alert & Oriented to person, place, and time.   Normal affect and insight.  Appropriate questions.  Moving all 4 extremities with gross sensory intact.   Auscultation of the abdomen/flank demonstrates no audible bruit.    Mallampati Score:  2  Imaging: No results found.  Labs:  CBC: No results for input(s): WBC, HGB, HCT, PLT in the last 8760 hours.  COAGS: No results for input(s): INR, APTT in the last 8760 hours.  BMP: No results for input(s): NA, K, CL, CO2, GLUCOSE, BUN, CALCIUM, CREATININE, GFRNONAA, GFRAA in the last 8760 hours.  Invalid input(s): CMP  LIVER FUNCTION TESTS: No results for input(s): BILITOT, AST, ALT, ALKPHOS, PROT, ALBUMIN in the last 8760 hours.  TUMOR MARKERS: No results for input(s): AFPTM, CEA, CA199, CHROMGRNA in the last 8760 hours.  Assessment and Plan:  [name] is a 51 year old male with a history of right AML, SP embolization for acute hemorrhage performed 03/21/2014.    Surveillance MRI has showed no concerning findings of growth, and he remains asymptomatic.    We discussed the natural history of AML after treatment, with multiple small series reporting no recurrence during post-operative surveillance period.  Because there are series that report up to 20-30% recurrence rates, it is reasonable to continue surveillance.    Given the stable appearance on MRI and his absence of symptoms, I think it is reasonable to schedule a 6 month CTA abdomen.  At this time we can determine if there is a persisting or enlarging  component that would warrant angiogram and treatment.  I do not think that angiogram at this time is necessary.    I discussed with him our plan, and he agrees.  His question is regarding the chance of recurrence, which I think is low chance overall.    I believe he can continue daily activities, including exercise.  I would advise avoiding collision sports/activities.    Plan: - Follow up appt with Dr. Earleen Newport with CTA abdomen - Observe all of his follow up appointments  Electronically Signed: Corrie Mckusick 11/02/2016, 9:41 AM   I spent a total of    15 Minutes in face to face in clinical consultation, greater than 50% of which was counseling/coordinating care  for right renal AML, status post embolization.

## 2017-01-17 ENCOUNTER — Encounter: Payer: Self-pay | Admitting: Interventional Radiology

## 2017-03-30 ENCOUNTER — Other Ambulatory Visit: Payer: Self-pay | Admitting: Interventional Radiology

## 2017-03-30 ENCOUNTER — Other Ambulatory Visit: Payer: Self-pay | Admitting: Radiology

## 2017-03-30 DIAGNOSIS — D1771 Benign lipomatous neoplasm of kidney: Secondary | ICD-10-CM

## 2017-04-08 ENCOUNTER — Ambulatory Visit (HOSPITAL_COMMUNITY)
Admission: RE | Admit: 2017-04-08 | Discharge: 2017-04-08 | Disposition: A | Discharge status: Home / Self Care | Payer: 59 | Source: Ambulatory Visit | Attending: Interventional Radiology | Admitting: Interventional Radiology

## 2017-04-08 DIAGNOSIS — D1771 Benign lipomatous neoplasm of kidney: Secondary | ICD-10-CM | POA: Diagnosis not present

## 2017-04-08 DIAGNOSIS — C9200 Acute myeloblastic leukemia, not having achieved remission: Secondary | ICD-10-CM | POA: Insufficient documentation

## 2017-04-08 DIAGNOSIS — C641 Malignant neoplasm of right kidney, except renal pelvis: Secondary | ICD-10-CM | POA: Diagnosis not present

## 2017-04-08 MED ORDER — IOPAMIDOL (ISOVUE-370) INJECTION 76%
INTRAVENOUS | Status: AC
  Filled 2017-04-08: qty 100

## 2017-04-08 MED ORDER — IOPAMIDOL (ISOVUE-370) INJECTION 76%
100.0000 mL | Freq: Once | INTRAVENOUS | Status: AC | PRN
  Administered 2017-04-08: 100 mL via INTRAVENOUS

## 2017-04-14 ENCOUNTER — Encounter: Payer: Self-pay | Admitting: *Deleted

## 2017-04-14 ENCOUNTER — Ambulatory Visit
Admission: RE | Admit: 2017-04-14 | Discharge: 2017-04-14 | Disposition: A | Discharge status: Home / Self Care | Payer: 59 | Source: Ambulatory Visit | Attending: Interventional Radiology | Admitting: Interventional Radiology

## 2017-04-14 DIAGNOSIS — D3001 Benign neoplasm of right kidney: Secondary | ICD-10-CM | POA: Diagnosis not present

## 2017-04-14 DIAGNOSIS — Z9889 Other specified postprocedural states: Secondary | ICD-10-CM | POA: Diagnosis not present

## 2017-04-14 DIAGNOSIS — D1771 Benign lipomatous neoplasm of kidney: Secondary | ICD-10-CM

## 2017-04-14 HISTORY — PX: IR RADIOLOGIST EVAL & MGMT: IMG5224

## 2017-04-14 NOTE — Progress Notes (Signed)
Chief Complaint: Patient was seen in consultation today for  Chief Complaint  Patient presents with  . Follow-up    follow Embolization of Right Renal Angiomyolipoma    Referring Physician(s): Herrick,Benjamin W  Supervising Physician: Corrie Mckusick  History of Present Illness: [name] is a 52 y.o. male who is here today for follow-up after embolization of a right renal AML which was done 03/21/2014.  Mr Hawker was treated as an in-patient at [facility] in December 2015, after he originally presented with a large peri-nephric hematoma.    He was working in his yard at the time, when he experienced acute lower right back pain which worsened over time, prompting his visit to the ED.    He was admitted with the hematoma, and he underwent angiogram and embolization of the right AML on 03/21/2014.    This was performed with coil embo of a large intra-tumoral arterial aneurysm/pseudoaneurysm, as well as particle embolization.    After his discharge, he experienced about 1 to 2 months of fatigue and malaise, but tells me that resolved in February of 2016.  He denies any back pain/flank pain, no hematuria, no recurrent admissions.    CTA scan done 04/08/2017 showed reproducible comparison measurements of the right upper pole renal angiomyolipoma are difficult due to vague margination, especially on prior MRI scans. Subtle tumor vascularity and differences in the perinephric fat are actually probably better delineated by CT.  Current maximal dimensions of abnormal vascularity within the more well visualized portion of the superior aspect of the right AML are felt to be stable to slightly smaller over the last two years.  Dimensions are likely underestimated on some of the prior MRI studies including the most recent MRI study on 10/01/2016 at which time maximal diameter of abnormal fat was likely closer to 8.5 cm.   Past Medical History:  Diagnosis Date  . GERD (gastroesophageal  reflux disease)   . Melanoma ([place])   . Pleural effusion, left   . Pneumonia   . Renal angiomyolipoma    Right renal angiomyolipoma  . Seasonal allergies     Past Surgical History:  Procedure Laterality Date  . CERVICAL DISCECTOMY    . IR RADIOLOGIST EVAL & MGMT  11/02/2016  . SPINAL FUSION      Allergies: Other  Medications: Prior to Admission medications   Medication Sig Start Date End Date Taking? Authorizing Provider  Multiple Vitamins-Minerals (MULTIVITAMIN GUMMIES ADULT PO) Take 1 each by mouth daily.   Yes [provider]  omeprazole (PRILOSEC) 20 MG capsule Take 20 mg by mouth daily.   Yes [provider]  naproxen sodium (ANAPROX) 220 MG tablet Take 220 mg by mouth 2 (two) times daily as needed.    [provider]     Family History  Problem Relation Age of Onset  . Cancer Father 52       lung cancer     Social History   Socioeconomic History  . Marital status: Married    Spouse name: Not on file  . Number of children: Not on file  . Years of education: Not on file  . Highest education level: Not on file  Social Needs  . Financial resource strain: Not on file  . Food insecurity - worry: Not on file  . Food insecurity - inability: Not on file  . Transportation needs - medical: Not on file  . Transportation needs - non-medical: Not on file  Occupational History  .  Not on file  Tobacco Use  . Smoking status: Former Smoker    Packs/day: 0.25    Years: 2.00    Pack years: 0.50  . Smokeless tobacco: Never Used  Substance and Sexual Activity  . Alcohol use: Yes    Comment: Occasional  . Drug use: No  . Sexual activity: Not on file  Other Topics Concern  . Not on file  Social History Narrative  . Not on file    Review of Systems: A 12 point ROS discussed Review of Systems  Constitutional: Negative.   HENT: Negative.   Cardiovascular: Negative.   Gastrointestinal: Negative.   Genitourinary: Negative.   Musculoskeletal:  Negative.   Skin: Negative.   Neurological: Negative.   Hematological: Negative.   Psychiatric/Behavioral: Negative.     Vital Signs: BP (!) 153/95   Pulse 77   Temp 98.7 F (37.1 C) (Oral)   Resp 16   Ht 6\' 1"  (1.854 m)   Wt 215 lb (97.5 kg)   SpO2 100%   BMI 28.37 kg/m   Physical Exam  Constitutional: He is oriented to person, place, and time. He appears well-developed.  HENT:  Head: Normocephalic and atraumatic.  Eyes: EOM are normal.  Neck: Normal range of motion.  Cardiovascular: Normal rate, regular rhythm and normal heart sounds.  Pulmonary/Chest: Effort normal and breath sounds normal. No stridor. No respiratory distress.  Abdominal: Soft.  Musculoskeletal: Normal range of motion.  Neurological: He is alert and oriented to person, place, and time.  Skin: Skin is warm and dry.  Psychiatric: He has a normal mood and affect. His behavior is normal. Judgment and thought content normal.  Vitals reviewed.   Imaging: Ct Angio Abdomen W &/or Wo Contrast  Result Date: 04/08/2017 CLINICAL DATA:  History of large right upper pole renal angiomyolipoma with spontaneous hemorrhage in 2015. Status post transcatheter embolization of pseudoaneurysm and other AML supply on 03/21/2014. EXAM: CT ANGIOGRAPHY ABDOMEN TECHNIQUE: Multidetector CT imaging of the abdomen was performed using the standard protocol during bolus administration of intravenous contrast. Multiplanar reconstructed images and MIPs were obtained and reviewed to evaluate the vascular anatomy. CONTRAST:  173mL ISOVUE-370 IOPAMIDOL (ISOVUE-370) INJECTION 76% COMPARISON:  Multiple post embolization MRI studies with the latest dated 10/01/2016. FINDINGS: VASCULAR Aorta: Normally patent. Celiac: Widely patent. SMA: Normally patent. Renals: Single renal artery show normal patency bilaterally. IMA: Normally patent. Inflow: Normally patent bilateral common iliac arteries and iliac bifurcations. Veins: Venous phase imaging shows no  venous abnormalities. Review of the MIP images confirms the above findings. NON-VASCULAR Lower chest: No acute abnormality. Hepatobiliary: No focal liver abnormality is seen. No gallstones, gallbladder wall thickening, or biliary dilatation. Pancreas: Unremarkable. No pancreatic ductal dilatation or surrounding inflammatory changes. Spleen: Normal in size without focal abnormality. Adrenals/Urinary Tract: Despite streak artifact from embolization coils within previously embolized pseudoaneurysm within the inferior aspect of the embolized right renal angiomyolipoma and adjacent spinal fusion hardware, the boundaries of abnormal vascularity in the superior aspect of the right renal AML are felt to be more clearly visualized by CT than MRI studies. Despite this, the boundaries of the actual tumor are vaguely marginated. Current maximal dimensions of residual abnormal vascularity are approximately 4.5 x 8.0 x 5.5 cm. In retrospect, on the axial unenhanced MRI images of the previous study in June, maximal transverse tumor dimensions are likely approximately 8.5 cm. No evidence of hemorrhage or pseudoaneurysm. No new renal lesions identified. Adrenal glands are unremarkable. Stomach/Bowel: No bowel obstruction or inflammation. No free  air. No visualized bowel lesions. Lymphatic: No enlarged lymph nodes identified. Other: No abnormal fluid collections.  No abdominal hernias. Musculoskeletal: No acute or significant osseous findings. IMPRESSION: Reproducible comparison measurements of the right upper pole renal angiomyolipoma are difficult due to vague margination, especially on prior MRI scans. Subtle tumor vascularity and differences in the perinephric fat are actually probably better delineated by CT. Current maximal dimensions of abnormal vascularity within the more well visualized portion of the superior aspect of the right AML are felt to be stable to slightly smaller over the last two years. Dimensions are likely  underestimated on some of the prior MRI studies including the most recent MRI study on 10/01/2016 at which time maximal diameter of abnormal fat was likely closer to 8.5 cm. Electronically Signed   By: Aletta Edouard M.D.   On: 04/08/2017 17:28    Labs:  CBC: No results for input(s): WBC, HGB, HCT, PLT in the last 8760 hours.  COAGS: No results for input(s): INR, APTT in the last 8760 hours.  BMP: No results for input(s): NA, K, CL, CO2, GLUCOSE, BUN, CALCIUM, CREATININE, GFRNONAA, GFRAA in the last 8760 hours.  Invalid input(s): CMP  LIVER FUNCTION TESTS: No results for input(s): BILITOT, AST, ALT, ALKPHOS, PROT, ALBUMIN in the last 8760 hours.  TUMOR MARKERS: No results for input(s): AFPTM, CEA, CA199, CHROMGRNA in the last 8760 hours.  Assessment:  S/P embolization of a right renal AML which was done 03/21/2014.  Currently stable and asymptomatic.  Will have him return in 1 year with CT scan with post ablation protocol.   Electronically Signed: Murrell Redden PA-C 04/14/2017, 4:14 PM   Please refer to Dr. Pasty Arch attestation of this note for management and plan.

## 2017-12-12 DIAGNOSIS — R030 Elevated blood-pressure reading, without diagnosis of hypertension: Secondary | ICD-10-CM | POA: Diagnosis not present

## 2017-12-27 DIAGNOSIS — D2262 Melanocytic nevi of left upper limb, including shoulder: Secondary | ICD-10-CM | POA: Diagnosis not present

## 2017-12-27 DIAGNOSIS — D2261 Melanocytic nevi of right upper limb, including shoulder: Secondary | ICD-10-CM | POA: Diagnosis not present

## 2017-12-27 DIAGNOSIS — Z85820 Personal history of malignant melanoma of skin: Secondary | ICD-10-CM | POA: Diagnosis not present

## 2018-04-25 ENCOUNTER — Other Ambulatory Visit: Payer: Self-pay | Admitting: Interventional Radiology

## 2018-04-25 DIAGNOSIS — D1771 Benign lipomatous neoplasm of kidney: Secondary | ICD-10-CM

## 2018-04-26 ENCOUNTER — Other Ambulatory Visit: Payer: Self-pay | Admitting: *Deleted

## 2018-04-26 DIAGNOSIS — N2889 Other specified disorders of kidney and ureter: Secondary | ICD-10-CM

## 2018-05-16 ENCOUNTER — Ambulatory Visit
Admission: RE | Admit: 2018-05-16 | Discharge: 2018-05-16 | Disposition: A | Discharge status: Home / Self Care | Payer: Commercial Managed Care - PPO | Source: Ambulatory Visit | Attending: Interventional Radiology | Admitting: Interventional Radiology

## 2018-05-16 ENCOUNTER — Ambulatory Visit (HOSPITAL_COMMUNITY)
Admission: RE | Admit: 2018-05-16 | Discharge: 2018-05-16 | Disposition: A | Discharge status: Home / Self Care | Payer: No Typology Code available for payment source | Source: Ambulatory Visit | Attending: Interventional Radiology | Admitting: Interventional Radiology

## 2018-05-16 ENCOUNTER — Encounter: Payer: Self-pay | Admitting: Radiology

## 2018-05-16 DIAGNOSIS — D1771 Benign lipomatous neoplasm of kidney: Secondary | ICD-10-CM | POA: Insufficient documentation

## 2018-05-16 HISTORY — PX: IR RADIOLOGIST EVAL & MGMT: IMG5224

## 2018-05-16 LAB — POCT I-STAT CREATININE: CREATININE: 0.9 mg/dL (ref 0.61–1.24)

## 2018-05-16 MED ORDER — IOPAMIDOL (ISOVUE-370) INJECTION 76%
100.0000 mL | Freq: Once | INTRAVENOUS | Status: AC | PRN
  Administered 2018-05-16: 100 mL via INTRAVENOUS

## 2018-05-16 MED ORDER — SODIUM CHLORIDE (PF) 0.9 % IJ SOLN
INTRAMUSCULAR | Status: DC
Start: 2018-05-16 — End: 2018-05-16
  Filled 2018-05-16: qty 50

## 2018-05-16 MED ORDER — IOPAMIDOL (ISOVUE-370) INJECTION 76%
INTRAVENOUS | Status: AC
  Filled 2018-05-16: qty 100

## 2018-05-16 NOTE — Progress Notes (Signed)
Referring Physician(s): Dr. Louis Meckel  Chief Complaint: The patient is seen in follow up today s/p embolization of hemorrhagic angiomyelolipoma of the right kidney on 03/19/2014  History of present illness: [name] is a 53 y.o. male who is here for follow-up after embolization of a right renal AML on 03/21/2014.  Patient has done well at home in the 5 years since his initial hemorrhagic event and presents to IR clinic today for follow-up and surveillance imaging. He denies concerns for his visit today.  He denies fever, chills, abdominal pain, back pain, urinary urgency or frequency, hematuria.  He reports his only recent health change is hypertension which is being followed and managed by his PCP.   Past Medical History:  Diagnosis Date  . GERD (gastroesophageal reflux disease)   . Melanoma ([place])   . Pleural effusion, left   . Pneumonia   . Renal angiomyolipoma    Right renal angiomyolipoma  . Seasonal allergies     Past Surgical History:  Procedure Laterality Date  . CERVICAL DISCECTOMY    . IR RADIOLOGIST EVAL & MGMT  11/02/2016  . IR RADIOLOGIST EVAL & MGMT  04/14/2017  . SPINAL FUSION      Allergies: Other  Medications: Prior to Admission medications   Medication Sig Start Date End Date Taking? Authorizing Provider  Multiple Vitamins-Minerals (MULTIVITAMIN GUMMIES ADULT PO) Take 1 each by mouth daily.   Yes [provider]  naproxen sodium (ANAPROX) 220 MG tablet Take 220 mg by mouth 2 (two) times daily as needed.   Yes [provider]  omeprazole (PRILOSEC) 20 MG capsule Take 20 mg by mouth daily.   Yes [provider]     Family History  Problem Relation Age of Onset  . Cancer Father 12       lung cancer     Social History   Socioeconomic History  . Marital status: Married    Spouse name: Not on file  . Number of children: Not on file  . Years of education: Not on file  . Highest education level: Not on file  Occupational  History  . Not on file  Social Needs  . Financial resource strain: Not on file  . Food insecurity:    Worry: Not on file    Inability: Not on file  . Transportation needs:    Medical: Not on file    Non-medical: Not on file  Tobacco Use  . Smoking status: Former Smoker    Packs/day: 0.25    Years: 2.00    Pack years: 0.50  . Smokeless tobacco: Never Used  Substance and Sexual Activity  . Alcohol use: Yes    Comment: Occasional  . Drug use: No  . Sexual activity: Not on file  Lifestyle  . Physical activity:    Days per week: Not on file    Minutes per session: Not on file  . Stress: Not on file  Relationships  . Social connections:    Talks on phone: Not on file    Gets together: Not on file    Attends religious service: Not on file    Active member of club or organization: Not on file    Attends meetings of clubs or organizations: Not on file    Relationship status: Not on file  Other Topics Concern  . Not on file  Social History Narrative  . Not on file     Vital Signs: BP (!) 158/87   Pulse Marland Kitchen)  102   Temp 98.4 F (36.9 C) (Oral)   Resp 16   Ht 6\' 1"  (1.854 m)   Wt 225 lb (102.1 kg)   SpO2 98%   BMI 29.69 kg/m   Physical Exam  NAD, sitting in chair comfortably Abdomen: soft, non-tender, non-distended Back/Flank: Non-tender to palpation.   Imaging: No results found.  Labs:  CBC: No results for input(s): WBC, HGB, HCT, PLT in the last 8760 hours.  COAGS: No results for input(s): INR, APTT in the last 8760 hours.  BMP: Recent Labs    05/16/18 0930  CREATININE 0.90    LIVER FUNCTION TESTS: No results for input(s): BILITOT, AST, ALT, ALKPHOS, PROT, ALBUMIN in the last 8760 hours.  Assessment: Right renal angiomyelolipoma s/p embolization procedure 03/21/2014. He remains stable and asymptomatic at follow-up today.  He did undergo repeat CT scan today which has been reviewed by Dr. Earleen Newport. Further plans as described by Dr. Earleen Newport.    Signed: Docia Barrier, PA 05/16/2018, 11:33 AM   Please refer to Dr. Pasty Arch attestation of this note for management and plan.

## 2019-11-30 ENCOUNTER — Telehealth: Payer: Commercial Managed Care - PPO | Admitting: Nurse Practitioner

## 2019-11-30 DIAGNOSIS — S81811A Laceration without foreign body, right lower leg, initial encounter: Secondary | ICD-10-CM

## 2019-11-30 NOTE — Progress Notes (Signed)
Based on what you shared with me it looks like you have a cut on your leg that needs to be seen.,that should be evaluated in a face to face office visit. I attempted to reach you by phone but had no answer. Because there is no record that you have had a tetanus shot, you need to go to urgent care for treatment of your leg. You could get lock jaw from this cut , which is life thretening, if you do not receive a tetanus shot.    NOTE: If you entered your credit card information for this eVisit, you will not be charged. You may see a "hold" on your card for the $35 but that hold will drop off and you will not have a charge processed.  If you are having a true medical emergency please call 911.     For an urgent face to face visit, [facility] has four urgent care centers for your convenience:   . [facility]    [phone]                  Get Driving Directions  [address], [place] [address] . 10 am to 8 pm Monday-Friday . 12 pm to 8 pm Saturday-Sunday   . [facility] [place]                  Get Driving Directions  [address], [place], [place] [address] . 8 am to 8 pm Monday-Friday . 9 am to 6 pm Saturday . 11 am to 6 pm Sunday   . [facility] [place]                  Get Driving Directions   [address], [place] [address] . 8 am to 8 pm Monday-Friday . 8 am to 4 pm Saturday-Sunday    . [facility] [place]                    Get Driving Directions  [phone]  [address], [place],  [address]  . Monday-Friday, 12 PM to 6 PM    Your e-visit answers were reviewed by a board certified advanced clinical practitioner to complete your personal care plan.  Thank you for using e-Visits.

## 2020-06-02 ENCOUNTER — Other Ambulatory Visit: Payer: Self-pay | Admitting: Interventional Radiology

## 2020-06-02 ENCOUNTER — Other Ambulatory Visit: Payer: Self-pay | Admitting: *Deleted

## 2020-06-02 DIAGNOSIS — D1771 Benign lipomatous neoplasm of kidney: Secondary | ICD-10-CM

## 2020-06-18 ENCOUNTER — Other Ambulatory Visit: Payer: Self-pay

## 2020-06-18 ENCOUNTER — Ambulatory Visit (HOSPITAL_COMMUNITY)
Admission: RE | Admit: 2020-06-18 | Discharge: 2020-06-18 | Disposition: A | Discharge status: Home / Self Care | Payer: No Typology Code available for payment source | Source: Ambulatory Visit | Attending: Interventional Radiology | Admitting: Interventional Radiology

## 2020-06-18 DIAGNOSIS — D1771 Benign lipomatous neoplasm of kidney: Secondary | ICD-10-CM

## 2020-06-18 MED ORDER — IOHEXOL 350 MG/ML SOLN
100.0000 mL | Freq: Once | INTRAVENOUS | Status: AC | PRN
  Administered 2020-06-18: 100 mL via INTRAVENOUS

## 2020-06-24 ENCOUNTER — Ambulatory Visit
Admission: RE | Admit: 2020-06-24 | Discharge: 2020-06-24 | Disposition: A | Discharge status: Home / Self Care | Payer: Commercial Managed Care - PPO | Source: Ambulatory Visit | Attending: Interventional Radiology | Admitting: Interventional Radiology

## 2020-06-24 ENCOUNTER — Encounter: Payer: Self-pay | Admitting: *Deleted

## 2020-06-24 ENCOUNTER — Other Ambulatory Visit: Payer: Self-pay

## 2020-06-24 DIAGNOSIS — D1771 Benign lipomatous neoplasm of kidney: Secondary | ICD-10-CM

## 2020-06-24 HISTORY — PX: IR RADIOLOGIST EVAL & MGMT: IMG5224

## 2020-06-24 NOTE — Progress Notes (Signed)
Referring Physician(s): Dr. Louis Meckel  Chief Complaint: Right Renal AML SP embolization  History of present illness: [name] a 55 y.o.malepresenting today as a scheduled follow up to [facility], SP treatment of spontaneous  Hemorrhage of right renal AML, treated December 2015.   He joins [place] today with telemedicine visit, given the current COVID situation.  We confirmed his identity with 2 personal identifiers.   He tells me today that he is doing fine.  He denies fever, chills, abdominal pain, back pain, urinary urgency or frequency, hematuria.   The last time we visited in the clinic, February 2020, he was undergoing treatment for hypertension by his PCP.  He tells me that since then, his weight has decreased from about 240 lbs to 195lbs, and this has resolved.  He takes his BP at home, and his last few SBP reading have been in the 120's.    He has had repeat CTA on 06/18/20.  Treatment changes of prior embolization of the right AML are seen.  I do not measure a significant difference from the size of the lesion on this recent study to the immediate prior dated 05/16/2018.  My measurement is ~3.8cm x 3.7cm.  On non-contrast chest CT 11/26/15 a similar location of the tumor measured 4.1cm x 4.0cm.  I think, within measurement error/differences in technique, that the size is not significantly increased.   Past Medical History:  Diagnosis Date  . GERD (gastroesophageal reflux disease)   . Melanoma ([facility])   . Pleural effusion, left   . Pneumonia   . Renal angiomyolipoma    Right renal angiomyolipoma  . Seasonal allergies     Past Surgical History:  Procedure Laterality Date  . CERVICAL DISCECTOMY    . IR RADIOLOGIST EVAL & MGMT  11/02/2016  . IR RADIOLOGIST EVAL & MGMT  04/14/2017  . IR RADIOLOGIST EVAL & MGMT  05/16/2018  . SPINAL FUSION      Allergies: Other  Medications: Prior to Admission medications   Medication Sig Start Date End Date Taking? Authorizing  Provider  Multiple Vitamins-Minerals (MULTIVITAMIN GUMMIES ADULT PO) Take 1 each by mouth daily.    [provider]  naproxen sodium (ANAPROX) 220 MG tablet Take 220 mg by mouth 2 (two) times daily as needed.    [provider]  omeprazole (PRILOSEC) 20 MG capsule Take 20 mg by mouth daily.    [provider]     Family History  Problem Relation Age of Onset  . Cancer Father 38       lung cancer     Social History   Socioeconomic History  . Marital status: Married    Spouse name: Not on file  . Number of children: Not on file  . Years of education: Not on file  . Highest education level: Not on file  Occupational History  . Not on file  Tobacco Use  . Smoking status: Former Smoker    Packs/day: 0.25    Years: 2.00    Pack years: 0.50  . Smokeless tobacco: Never Used  Substance and Sexual Activity  . Alcohol use: Yes    Comment: Occasional  . Drug use: No  . Sexual activity: Not on file  Other Topics Concern  . Not on file  Social History Narrative  . Not on file   Social Determinants of Health   Financial Resource Strain: Not on file  Food Insecurity: Not on file  Transportation Needs: Not on file  Physical Activity: Not on file  Stress: Not on file  Social Connections: Not on file       Review of Systems  Review of Systems: A 12 point ROS discussed and pertinent positives are indicated in the HPI above.  All other systems are negative.  Physical Exam No direct physical exam was performed (except for noted visual exam findings with Video Visits).    Vital Signs: There were no vitals taken for this visit.  Imaging: CT ANGIO ABDOMEN W &/OR WO CONTRAST  Result Date: 06/19/2020 CLINICAL DATA:  Renal angiomyolipoma. EXAM: CT ANGIOGRAPHY ABDOMEN TECHNIQUE: Multidetector CT imaging of the abdomen was performed using the standard protocol during bolus administration of intravenous contrast. Multiplanar reconstructed images and MIPs  were obtained and reviewed to evaluate the vascular anatomy. CONTRAST:  130mL OMNIPAQUE IOHEXOL 350 MG/ML SOLN COMPARISON:  May 16, 2018 FINDINGS: VASCULAR Aorta: Minimal atherosclerotic plaque in the abdominal aorta. No aneurysmal dilation. No periaortic stranding. Celiac: Patent without evidence of aneurysm, dissection, vasculitis or significant stenosis. SMA: Patent without evidence of aneurysm, dissection, vasculitis or significant stenosis. Renals: Post embolization of RIGHT renal AML with markedly hyperdense area compatible with embolization changes in the area of concern. No precontrast imaging was performed. No change since prior study, see below. Renal arteries are patent with single renal arteries bilaterally. IMA: Patent without evidence of aneurysm, dissection, vasculitis or significant stenosis. Inflow: Patent without evidence of aneurysm, dissection, vasculitis or significant stenosis. Veins: Smooth contour of the IVC. Review of the MIP images confirms the above findings. NON-VASCULAR Lower chest: Basilar atelectasis. Hepatobiliary: No focal, suspicious hepatic lesion. Portal vein is patent. No pericholecystic stranding. Pancreas: Normal, without mass, inflammation or ductal dilatation. Spleen: Spleen normal size and contour.  No focal lesion. Adrenals/Urinary Tract: Adrenal glands are normal. Dense embolization material adjacent to upper pole of the RIGHT kidney, surrounding fat with mixed density measuring as much as 4.1 x 4.1 cm, extending into RIGHT retroperitoneum. Overall the pattern of fat and vessels tracking through the fat in the RIGHT retroperitoneum shows no change, difficult to measure definitively but of similar size when measured in a similar fashion by this observer on the prior study. No suspicious renal lesions elsewhere. Stomach/Bowel: No acute gastrointestinal process to the extent evaluated. Lymphatic: There is no gastrohepatic or hepatoduodenal ligament lymphadenopathy. No  retroperitoneal or mesenteric lymphadenopathy. Other: No ascites. Musculoskeletal: Streak artifact limits assessment. Fusion of the spine from T12 through L2. Fracture of the pedicle screw at the T12 level on the LEFT is unchanged. The area appears fused from a bony perspective and without change in alignment. IMPRESSION: 1. Post embolization changes about the RIGHT kidney, upper pole without interval regrowth of tumor but with fatty density that surrounds the area with some adjacent vessels. Attention on follow-up. Area remains markedly decreased in size compared to imaging from 2016. 2. Chronic discontinuity/fracture of LEFT pedicle screw at the T12 level, signs of fusion from T12 through L2, area with concomitant bony fusion without changes in alignment. Correlate with any symptoms. 3. Aortic atherosclerosis. These results will be called to the ordering clinician or representative by the Radiologist Assistant, and communication documented in the PACS or [employer]. Aortic Atherosclerosis (ICD10-I70.0). Electronically Signed   By: Zetta Bills M.D.   On: 06/19/2020 10:55    Labs:  CBC: No results for input(s): WBC, HGB, HCT, PLT in the last 8760 hours.  COAGS: No results for input(s): INR, APTT in the last 8760 hours.  BMP: No results for  input(s): NA, K, CL, CO2, GLUCOSE, BUN, CALCIUM, CREATININE, GFRNONAA, GFRAA in the last 8760 hours.  Invalid input(s): CMP  LIVER FUNCTION TESTS: No results for input(s): BILITOT, AST, ALT, ALKPHOS, PROT, ALBUMIN in the last 8760 hours.  TUMOR MARKERS: No results for input(s): AFPTM, CEA, CA199, CHROMGRNA in the last 8760 hours.  Assessment and Plan:  [name] is a very pleasant 55 yo male SP treatment of spontaneously hemorrhagic right renal AML with embolization 03/21/2014.   He has had no problems in the past 6.5 years, and the surveillance CT studies reveal no significant change.    I did again discuss with him the fact that there is no  standard follow up/surveillance employed with post-treatment AML, but given his comfort level, a surveillance CT in another 24 months would be reasonable.  This is, in particular, in the absence of any additional right kidney and left kidney tumors, of which he has none.    He agrees, and would prefer to continue surveillance.    Plan: - 2 year clinic follow up with CTA abdomen     Electronically Signed: Corrie Mckusick 06/24/2020, 1:13 PM   I spent a total of    25 Minutes in remote  clinical consultation, greater than 50% of which was counseling/coordinating care for right renal AML.    Visit type: Audio only (telephone). Audio (no video) only due to patient's lack of internet/smartphone capability. Alternative for in-person consultation at [facility], [address] [facility], [place], [place]. This visit type was conducted due to national recommendations for restrictions regarding the COVID-19 Pandemic (e.g. social distancing).  This format is felt to be most appropriate for this patient at this time.  All issues noted in this document were discussed and addressed.

## 2022-06-02 ENCOUNTER — Other Ambulatory Visit: Payer: Self-pay | Admitting: Interventional Radiology

## 2022-06-02 DIAGNOSIS — D1771 Benign lipomatous neoplasm of kidney: Secondary | ICD-10-CM

## 2023-05-06 ENCOUNTER — Ambulatory Visit (HOSPITAL_BASED_OUTPATIENT_CLINIC_OR_DEPARTMENT_OTHER)
Admission: RE | Admit: 2023-05-06 | Discharge: 2023-05-06 | Disposition: A | Discharge status: Home / Self Care | Payer: Commercial Managed Care - PPO | Source: Ambulatory Visit | Attending: Family Medicine | Admitting: Family Medicine

## 2023-05-06 ENCOUNTER — Ambulatory Visit
Admission: EM | Admit: 2023-05-06 | Discharge: 2023-05-06 | Disposition: A | Discharge status: Home / Self Care | Payer: Commercial Managed Care - PPO | Attending: Family Medicine | Admitting: Family Medicine

## 2023-05-06 DIAGNOSIS — E291 Testicular hypofunction: Secondary | ICD-10-CM | POA: Insufficient documentation

## 2023-05-06 DIAGNOSIS — R7989 Other specified abnormal findings of blood chemistry: Secondary | ICD-10-CM | POA: Insufficient documentation

## 2023-05-06 DIAGNOSIS — D1771 Benign lipomatous neoplasm of kidney: Secondary | ICD-10-CM | POA: Insufficient documentation

## 2023-05-06 DIAGNOSIS — K6289 Other specified diseases of anus and rectum: Secondary | ICD-10-CM | POA: Insufficient documentation

## 2023-05-06 DIAGNOSIS — R140 Abdominal distension (gaseous): Secondary | ICD-10-CM | POA: Insufficient documentation

## 2023-05-06 DIAGNOSIS — E7800 Pure hypercholesterolemia, unspecified: Secondary | ICD-10-CM | POA: Insufficient documentation

## 2023-05-06 DIAGNOSIS — M25572 Pain in left ankle and joints of left foot: Secondary | ICD-10-CM | POA: Insufficient documentation

## 2023-05-06 DIAGNOSIS — R202 Paresthesia of skin: Secondary | ICD-10-CM | POA: Insufficient documentation

## 2023-05-06 DIAGNOSIS — K2100 Gastro-esophageal reflux disease with esophagitis, without bleeding: Secondary | ICD-10-CM | POA: Insufficient documentation

## 2023-05-06 DIAGNOSIS — M51379 Other intervertebral disc degeneration, lumbosacral region without mention of lumbar back pain or lower extremity pain: Secondary | ICD-10-CM | POA: Insufficient documentation

## 2023-05-06 DIAGNOSIS — Z85820 Personal history of malignant melanoma of skin: Secondary | ICD-10-CM | POA: Insufficient documentation

## 2023-05-06 MED ORDER — COLCHICINE 0.6 MG PO TABS: 0.6000 mg | ORAL_TABLET | Freq: Every day | ORAL | 0 refills | Status: AC

## 2023-05-06 MED ORDER — METHYLPREDNISOLONE 4 MG PO TBPK: ORAL_TABLET | ORAL | 0 refills | Status: AC

## 2023-05-06 NOTE — ED Provider Notes (Incomplete Revision)
[facility]    CSN: [id number] Arrival date & time: 05/06/23  4098      History   Chief Complaint Chief Complaint  Patient presents with   Ankle Pain    HPI [name] is a 58 y.o. male.    Ankle Pain  Patient is here today for left ankle pain.  He started with soreness to the left ankle off/on for the last 6 months.  He is a left side sleeper and felt that may be contributing.  Last evening he took and shower and then felt pain afterward, woke up this morning and the ankle was very painful.  He noted some swelling.  He did use ice with some help.  He could not really walk on the foot.  No injury.  No prior h/o gout.     {The patient has not been seen in Urgent Care in the last 3 years. :1}     Past Medical History:  Diagnosis Date   GERD (gastroesophageal reflux disease)    Melanoma ([facility])    Pleural effusion, left    Pneumonia    Renal angiomyolipoma    Right renal angiomyolipoma   Seasonal allergies     Patient Active Problem List   Diagnosis Date Noted   Bloating 05/06/2023   Degeneration of lumbar or lumbosacral intervertebral disc 05/06/2023   Gastro-esophageal reflux disease with esophagitis 05/06/2023   Low testosterone 05/06/2023   Pain in rectum 05/06/2023   Paresthesia 05/06/2023   Personal history of malignant melanoma of skin 05/06/2023   Pure hypercholesterolemia, unspecified 05/06/2023   Testicular hypofunction 05/06/2023   Angiomyolipoma of right kidney 05/06/2023   Lung nodule < 6cm on CT 11/27/2015   Anemia 07/19/2014   Angiomyolipoma of kidney    Retroperitoneal hemorrhage    Right renal mass 03/17/2014    Past Surgical History:  Procedure Laterality Date   CERVICAL DISCECTOMY     IR RADIOLOGIST EVAL & MGMT  11/02/2016   IR RADIOLOGIST EVAL & MGMT  04/14/2017   IR RADIOLOGIST EVAL & MGMT  05/16/2018   IR RADIOLOGIST EVAL & MGMT  06/24/2020   SPINAL FUSION         Home Medications    Prior to Admission  medications   Medication Sig Start Date End Date Taking? Authorizing Provider  Testosterone 25 MG/2.5GM (1%) GEL 2 packets  to skin in the morning to shoulder, upper arms or abdomen Transdermal Once a day for 30 days 10/16/19  Yes [provider]  EPINEPHrine (EPIPEN 2-PAK) 0.3 mg/0.3 mL IJ SOAJ injection as directed Injection for severe allergice reaction    [provider]  Multiple Vitamins-Minerals (MULTIVITAMIN GUMMIES ADULT PO) Take 1 each by mouth daily.    [provider]  naproxen sodium (ALEVE) 220 MG tablet 1 tablet with food or milk as needed Orally every 12 hrs    [provider]  naproxen sodium (ANAPROX) 220 MG tablet Take 220 mg by mouth 2 (two) times daily as needed.    [provider]  omeprazole (PRILOSEC OTC) 20 MG tablet 1 tablet Orally Once a day    [provider]  omeprazole (PRILOSEC) 20 MG capsule Take 20 mg by mouth daily.    [provider]    Family History Family History  Problem Relation Age of Onset   Cancer Father 66       lung cancer     Social History Social History   Tobacco Use  Smoking status: Former    Current packs/day: 0.25    Average packs/day: 0.3 packs/day for 2.0 years (0.5 ttl pk-yrs)    Types: Cigarettes    Passive exposure: Never   Smokeless tobacco: Never  Vaping Use   Vaping status: Never Used  Substance Use Topics   Alcohol use: Yes    Comment: Occasional   Drug use: No     Allergies   Bee venom   Review of Systems Review of Systems  Constitutional: Negative.   HENT: Negative.    Respiratory: Negative.    Cardiovascular: Negative.   Gastrointestinal: Negative.   Musculoskeletal:  Positive for joint swelling.     Physical Exam Triage Vital Signs ED Triage Vitals  Encounter Vitals Group     BP 05/06/23 0957 128/79     Systolic BP Percentile --      Diastolic BP Percentile --      Pulse Rate 05/06/23 0957 93     Resp 05/06/23 0957 20     Temp  05/06/23 0957 98.5 F (36.9 C)     Temp Source 05/06/23 0957 Oral     SpO2 05/06/23 0957 99 %     Weight 05/06/23 0955 220 lb (99.8 kg)     Height 05/06/23 0955 6\' 1"  (1.854 m)     Head Circumference --      Peak Flow --      Pain Score 05/06/23 0952 10     Pain Loc --      Pain Education --      Exclude from Growth Chart --    No data found.  Updated Vital Signs BP 128/79 (BP Location: Left Arm)   Pulse 93   Temp 98.5 F (36.9 C) (Oral)   Resp 20   Ht 6\' 1"  (1.854 m)   Wt 99.8 kg   SpO2 99%   BMI 29.03 kg/m   Visual Acuity Right Eye Distance:   Left Eye Distance:   Bilateral Distance:    Right Eye Near:   Left Eye Near:    Bilateral Near:     Physical Exam Constitutional:      General: He is not in acute distress.    Appearance: Normal appearance. He is normal weight.  Cardiovascular:     Rate and Rhythm: Normal rate and regular rhythm.  Pulmonary:     Effort: Pulmonary effort is normal.     Breath sounds: Normal breath sounds.  Musculoskeletal:     Comments: The left lateral ankle is red, swollen, very tender;  painful with any movement.   Skin:    General: Skin is warm.  Neurological:     General: No focal deficit present.     Mental Status: He is alert.  Psychiatric:        Mood and Affect: Mood normal.      UC Treatments / Results  Labs (all labs ordered are listed, but only abnormal results are displayed) Labs Reviewed  URIC ACID  CBC    EKG   Radiology No results found.  Procedures Procedures (including critical care time)  Medications Ordered in UC Medications - No data to display  Initial Impression / Assessment and Plan / UC Course  I have reviewed the triage vital signs and the nursing notes.  Pertinent labs & imaging results that were available during my care of the patient were reviewed by me and considered in my medical decision making (see chart for details).  Final Clinical Impressions(s) /  UC Diagnoses   Final  diagnoses:  Acute left ankle pain     Discharge Instructions      You were seen today left ankle pain.  This is likely gout.  I have sent out medications to help with symptoms.   I have ordered blood work for further evaluation, and have ordered an xray.  Please get this done at the [facility] at [facility] [address] [place].  You may use tylenol and motrin/ibuprofen for pain as well.  Please follow up with your primary care provider for further care if this happens again.     ED Prescriptions     Medication Sig Dispense Auth. Provider   methylPREDNISolone (MEDROL DOSEPAK) 4 MG TBPK tablet Take as directed 1 each Tylan Briguglio, MD   colchicine 0.6 MG tablet Take 1 tablet (0.6 mg total) by mouth daily. 5 tablet Jannifer Franklin, MD      PDMP not reviewed this encounter.   Jannifer Franklin, MD 05/06/23 1016

## 2023-05-06 NOTE — Discharge Instructions (Addendum)
You were seen today left ankle pain.  This is likely gout.  I have sent out medications to help with symptoms.   I have ordered blood work for further evaluation, and have ordered an xray.  Please get this done at the [facility] at [facility] [address], [place].  You may use tylenol and motrin/ibuprofen for pain as well.  Please follow up with your primary care provider for further care if this happens again.

## 2023-05-06 NOTE — ED Triage Notes (Signed)
"  I am a left side sleeper, about 6mos of so ago I started waking up with soreness in left ankle, yesterday evening after shower, scrubbing feet, felt this pain during day, woke up during the night with this same pain now this morning very painful". "My wife says it may be GOUT, I have never had it before".

## 2023-05-06 NOTE — ED Provider Notes (Signed)
[facility]    CSN: [id number] Arrival date & time: 05/06/23  4098      History   Chief Complaint Chief Complaint  Patient presents with   Ankle Pain    HPI [name] is a 58 y.o. male.    Ankle Pain  Patient is here today for left ankle pain.  He started with soreness to the left ankle off/on for the last 6 months.  He is a left side sleeper and felt that may be contributing.  Last evening he took and shower and then felt pain afterward, woke up this morning and the ankle was very painful.  He noted some swelling.  He did use ice with some help.  He could not really walk on the foot.  No injury.  No prior h/o gout.       Past Medical History:  Diagnosis Date   GERD (gastroesophageal reflux disease)    Melanoma ([facility])    Pleural effusion, left    Pneumonia    Renal angiomyolipoma    Right renal angiomyolipoma   Seasonal allergies     Patient Active Problem List   Diagnosis Date Noted   Bloating 05/06/2023   Degeneration of lumbar or lumbosacral intervertebral disc 05/06/2023   Gastro-esophageal reflux disease with esophagitis 05/06/2023   Low testosterone 05/06/2023   Pain in rectum 05/06/2023   Paresthesia 05/06/2023   Personal history of malignant melanoma of skin 05/06/2023   Pure hypercholesterolemia, unspecified 05/06/2023   Testicular hypofunction 05/06/2023   Angiomyolipoma of right kidney 05/06/2023   Lung nodule < 6cm on CT 11/27/2015   Anemia 07/19/2014   Angiomyolipoma of kidney    Retroperitoneal hemorrhage    Right renal mass 03/17/2014    Past Surgical History:  Procedure Laterality Date   CERVICAL DISCECTOMY     IR RADIOLOGIST EVAL & MGMT  11/02/2016   IR RADIOLOGIST EVAL & MGMT  04/14/2017   IR RADIOLOGIST EVAL & MGMT  05/16/2018   IR RADIOLOGIST EVAL & MGMT  06/24/2020   SPINAL FUSION         Home Medications    Prior to Admission medications   Medication Sig Start Date End Date Taking? Authorizing Provider   Testosterone 25 MG/2.5GM (1%) GEL 2 packets  to skin in the morning to shoulder, upper arms or abdomen Transdermal Once a day for 30 days 10/16/19  Yes [provider]  EPINEPHrine (EPIPEN 2-PAK) 0.3 mg/0.3 mL IJ SOAJ injection as directed Injection for severe allergice reaction    [provider]  Multiple Vitamins-Minerals (MULTIVITAMIN GUMMIES ADULT PO) Take 1 each by mouth daily.    [provider]  naproxen sodium (ALEVE) 220 MG tablet 1 tablet with food or milk as needed Orally every 12 hrs    [provider]  naproxen sodium (ANAPROX) 220 MG tablet Take 220 mg by mouth 2 (two) times daily as needed.    [provider]  omeprazole (PRILOSEC OTC) 20 MG tablet 1 tablet Orally Once a day    [provider]  omeprazole (PRILOSEC) 20 MG capsule Take 20 mg by mouth daily.    [provider]    Family History Family History  Problem Relation Age of Onset   Cancer Father 14       lung cancer     Social History Social History   Tobacco Use   Smoking status: Former    Current packs/day: 0.25    Average packs/day: 0.3 packs/day for  2.0 years (0.5 ttl pk-yrs)    Types: Cigarettes    Passive exposure: Never   Smokeless tobacco: Never  Vaping Use   Vaping status: Never Used  Substance Use Topics   Alcohol use: Yes    Comment: Occasional   Drug use: No     Allergies   Bee venom   Review of Systems Review of Systems  Constitutional: Negative.   HENT: Negative.    Respiratory: Negative.    Cardiovascular: Negative.   Gastrointestinal: Negative.   Musculoskeletal:  Positive for joint swelling.     Physical Exam Triage Vital Signs ED Triage Vitals  Encounter Vitals Group     BP 05/06/23 0957 128/79     Systolic BP Percentile --      Diastolic BP Percentile --      Pulse Rate 05/06/23 0957 93     Resp 05/06/23 0957 20     Temp 05/06/23 0957 98.5 F (36.9 C)     Temp Source 05/06/23 0957 Oral     SpO2  05/06/23 0957 99 %     Weight 05/06/23 0955 220 lb (99.8 kg)     Height 05/06/23 0955 6\' 1"  (1.854 m)     Head Circumference --      Peak Flow --      Pain Score 05/06/23 0952 10     Pain Loc --      Pain Education --      Exclude from Growth Chart --    No data found.  Updated Vital Signs BP 128/79 (BP Location: Left Arm)   Pulse 93   Temp 98.5 F (36.9 C) (Oral)   Resp 20   Ht 6\' 1"  (1.854 m)   Wt 99.8 kg   SpO2 99%   BMI 29.03 kg/m   Visual Acuity Right Eye Distance:   Left Eye Distance:   Bilateral Distance:    Right Eye Near:   Left Eye Near:    Bilateral Near:     Physical Exam Constitutional:      General: He is not in acute distress.    Appearance: Normal appearance. He is normal weight.  Cardiovascular:     Rate and Rhythm: Normal rate and regular rhythm.  Pulmonary:     Effort: Pulmonary effort is normal.     Breath sounds: Normal breath sounds.  Musculoskeletal:     Comments: The left lateral ankle is red, swollen, very tender;  painful with any movement.   Skin:    General: Skin is warm.  Neurological:     General: No focal deficit present.     Mental Status: He is alert.  Psychiatric:        Mood and Affect: Mood normal.      UC Treatments / Results  Labs (all labs ordered are listed, but only abnormal results are displayed) Labs Reviewed  URIC ACID  CBC    EKG   Radiology No results found.  Procedures Procedures (including critical care time)  Medications Ordered in UC Medications - No data to display  Initial Impression / Assessment and Plan / UC Course  I have reviewed the triage vital signs and the nursing notes.  Pertinent labs & imaging results that were available during my care of the patient were reviewed by me and considered in my medical decision making (see chart for details).  Final Clinical Impressions(s) / UC Diagnoses   Final diagnoses:  Acute left ankle pain     Discharge Instructions  You were  seen today left ankle pain.  This is likely gout.  I have sent out medications to help with symptoms.   I have ordered blood work for further evaluation, and have ordered an xray.  Please get this done at the [facility] at [facility] [address], [place].  You may use tylenol and motrin/ibuprofen for pain as well.  Please follow up with your primary care provider for further care if this happens again.     ED Prescriptions     Medication Sig Dispense Auth. Provider   methylPREDNISolone (MEDROL DOSEPAK) 4 MG TBPK tablet Take as directed 1 each Gurnoor Ursua, MD   colchicine 0.6 MG tablet Take 1 tablet (0.6 mg total) by mouth daily. 5 tablet Jannifer Franklin, MD      PDMP not reviewed this encounter.   Jannifer Franklin, MD 05/06/23 1016

## 2023-05-07 ENCOUNTER — Ambulatory Visit: Payer: Self-pay

## 2023-05-07 LAB — CBC
Hematocrit: 48.6 % (ref 37.5–51.0)
Hemoglobin: 16.3 g/dL (ref 13.0–17.7)
MCH: 32.1 pg (ref 26.6–33.0)
MCHC: 33.5 g/dL (ref 31.5–35.7)
MCV: 96 fL (ref 79–97)
Platelets: 280 10*3/uL (ref 150–450)
RBC: 5.08 x10E6/uL (ref 4.14–5.80)
RDW: 12.1 % (ref 11.6–15.4)
WBC: 9.7 10*3/uL (ref 3.4–10.8)

## 2023-05-07 LAB — URIC ACID: Uric Acid: 7.9 mg/dL (ref 3.8–8.4)
